# Patient Record
Sex: Male | Born: 1950 | Race: White | Hispanic: No | Marital: Married | Smoking: Former smoker
Health system: Southern US, Community
[De-identification: ages and names within clinical notes are randomized; demographics above are authoritative.]

## PROBLEM LIST (undated history)

## (undated) DIAGNOSIS — I10 Essential (primary) hypertension: Secondary | ICD-10-CM

## (undated) DIAGNOSIS — K8020 Calculus of gallbladder without cholecystitis without obstruction: Secondary | ICD-10-CM

## (undated) DIAGNOSIS — N183 Chronic kidney disease, stage 3 unspecified: Secondary | ICD-10-CM

## (undated) DIAGNOSIS — E119 Type 2 diabetes mellitus without complications: Secondary | ICD-10-CM

## (undated) DIAGNOSIS — I2510 Atherosclerotic heart disease of native coronary artery without angina pectoris: Principal | ICD-10-CM

## (undated) DIAGNOSIS — I5030 Unspecified diastolic (congestive) heart failure: Secondary | ICD-10-CM

## (undated) DIAGNOSIS — G4733 Obstructive sleep apnea (adult) (pediatric): Secondary | ICD-10-CM

## (undated) DIAGNOSIS — E785 Hyperlipidemia, unspecified: Secondary | ICD-10-CM

## (undated) HISTORY — DX: Hyperlipidemia, unspecified: E78.5

## (undated) HISTORY — PX: CARDIAC CATHETERIZATION: SHX172

## (undated) HISTORY — PX: TONSILLECTOMY: SHX5217

## (undated) HISTORY — DX: Chronic kidney disease, stage 3 (moderate): N18.3

## (undated) HISTORY — DX: Atherosclerotic heart disease of native coronary artery without angina pectoris: I25.10

## (undated) HISTORY — DX: Unspecified diastolic (congestive) heart failure: I50.30

## (undated) HISTORY — DX: Morbid (severe) obesity due to excess calories: E66.01

## (undated) HISTORY — PX: CATARACT EXTRACTION: SUR2

## (undated) HISTORY — PX: LAPAROSCOPIC GASTRIC BANDING: SHX1100

## (undated) HISTORY — DX: Calculus of gallbladder without cholecystitis without obstruction: K80.20

## (undated) HISTORY — DX: Obstructive sleep apnea (adult) (pediatric): G47.33

## (undated) HISTORY — PX: TOTAL KNEE ARTHROPLASTY: SHX125

## (undated) HISTORY — DX: Essential (primary) hypertension: I10

## (undated) HISTORY — DX: Type 2 diabetes mellitus without complications: E11.9

## (undated) HISTORY — DX: Chronic kidney disease, stage 3 unspecified: N18.30

## (undated) HISTORY — PX: RETINAL DETACHMENT SURGERY: SHX105

---

## 2004-03-15 ENCOUNTER — Ambulatory Visit: Admission: RE | Admit: 2004-03-15 | Discharge: 2004-03-15 | Payer: Self-pay | Admitting: Orthopedic Surgery

## 2004-03-15 ENCOUNTER — Encounter: Payer: Self-pay | Admitting: Internal Medicine

## 2004-03-16 ENCOUNTER — Ambulatory Visit: Payer: Self-pay | Admitting: Internal Medicine

## 2004-03-18 ENCOUNTER — Encounter: Payer: Self-pay | Admitting: Internal Medicine

## 2004-03-18 ENCOUNTER — Ambulatory Visit: Payer: Self-pay

## 2004-03-22 ENCOUNTER — Ambulatory Visit: Admission: RE | Admit: 2004-03-22 | Discharge: 2004-03-22 | Payer: Self-pay | Admitting: Orthopedic Surgery

## 2004-03-25 ENCOUNTER — Ambulatory Visit: Payer: Self-pay | Admitting: Internal Medicine

## 2004-03-30 ENCOUNTER — Ambulatory Visit: Payer: Self-pay | Admitting: Internal Medicine

## 2004-04-02 ENCOUNTER — Inpatient Hospital Stay (HOSPITAL_BASED_OUTPATIENT_CLINIC_OR_DEPARTMENT_OTHER): Admission: RE | Admit: 2004-04-02 | Discharge: 2004-04-02 | Payer: Self-pay | Admitting: Internal Medicine

## 2004-04-09 ENCOUNTER — Ambulatory Visit: Payer: Self-pay | Admitting: Cardiology

## 2004-04-15 ENCOUNTER — Ambulatory Visit: Payer: Self-pay | Admitting: Internal Medicine

## 2004-04-29 ENCOUNTER — Ambulatory Visit: Payer: Self-pay | Admitting: Cardiology

## 2004-05-04 ENCOUNTER — Inpatient Hospital Stay (HOSPITAL_COMMUNITY): Admission: RE | Admit: 2004-05-04 | Discharge: 2004-05-07 | Payer: Self-pay | Admitting: Orthopedic Surgery

## 2004-05-04 ENCOUNTER — Ambulatory Visit: Payer: Self-pay | Admitting: Internal Medicine

## 2004-07-14 ENCOUNTER — Ambulatory Visit: Payer: Self-pay | Admitting: Internal Medicine

## 2004-10-18 ENCOUNTER — Ambulatory Visit: Payer: Self-pay | Admitting: Internal Medicine

## 2005-01-18 ENCOUNTER — Ambulatory Visit: Payer: Self-pay | Admitting: Internal Medicine

## 2005-03-01 ENCOUNTER — Ambulatory Visit: Payer: Self-pay | Admitting: Internal Medicine

## 2005-03-03 ENCOUNTER — Ambulatory Visit: Payer: Self-pay | Admitting: Emergency Medicine

## 2005-04-05 ENCOUNTER — Encounter: Payer: Self-pay | Admitting: Internal Medicine

## 2005-04-05 ENCOUNTER — Ambulatory Visit (HOSPITAL_BASED_OUTPATIENT_CLINIC_OR_DEPARTMENT_OTHER): Admission: RE | Admit: 2005-04-05 | Discharge: 2005-04-05 | Payer: Self-pay | Admitting: Emergency Medicine

## 2005-04-11 ENCOUNTER — Inpatient Hospital Stay (HOSPITAL_COMMUNITY): Admission: RE | Admit: 2005-04-11 | Discharge: 2005-04-14 | Payer: Self-pay | Admitting: Orthopedic Surgery

## 2005-04-11 ENCOUNTER — Ambulatory Visit: Payer: Self-pay | Admitting: Internal Medicine

## 2005-04-13 ENCOUNTER — Ambulatory Visit: Payer: Self-pay | Admitting: Pulmonary Disease

## 2005-04-19 ENCOUNTER — Ambulatory Visit: Payer: Self-pay | Admitting: Internal Medicine

## 2005-05-27 ENCOUNTER — Ambulatory Visit: Payer: Self-pay | Admitting: Emergency Medicine

## 2005-07-18 ENCOUNTER — Ambulatory Visit: Payer: Self-pay | Admitting: Internal Medicine

## 2005-08-26 ENCOUNTER — Ambulatory Visit: Payer: Self-pay | Admitting: Internal Medicine

## 2005-08-31 ENCOUNTER — Ambulatory Visit: Payer: Self-pay | Admitting: Internal Medicine

## 2005-09-14 ENCOUNTER — Ambulatory Visit: Payer: Self-pay | Admitting: Emergency Medicine

## 2005-09-14 ENCOUNTER — Encounter: Payer: Self-pay | Admitting: Internal Medicine

## 2005-10-17 ENCOUNTER — Ambulatory Visit: Payer: Self-pay | Admitting: Internal Medicine

## 2006-01-17 ENCOUNTER — Ambulatory Visit: Payer: Self-pay | Admitting: Internal Medicine

## 2006-04-18 ENCOUNTER — Ambulatory Visit: Payer: Self-pay | Admitting: Internal Medicine

## 2006-04-18 LAB — CONVERTED CEMR LAB
ALT: 23 units/L (ref 0–40)
AST: 26 units/L (ref 0–37)
Albumin: 3.9 g/dL (ref 3.5–5.2)
Alkaline Phosphatase: 54 units/L (ref 39–117)
BUN: 26 mg/dL — ABNORMAL HIGH (ref 6–23)
CO2: 27 meq/L (ref 19–32)
Calcium: 9.8 mg/dL (ref 8.4–10.5)
Chloride: 104 meq/L (ref 96–112)
Creatinine, Ser: 1.5 mg/dL (ref 0.4–1.5)
Creatinine,U: 174.8 mg/dL
GFR calc non Af Amer: 51 mL/min
Glomerular Filtration Rate, Af Am: 62 mL/min/{1.73_m2}
Glucose, Bld: 86 mg/dL (ref 70–99)
Hgb A1c MFr Bld: 7.9 % — ABNORMAL HIGH (ref 4.6–6.0)
Microalb Creat Ratio: 71.5 mg/g — ABNORMAL HIGH (ref 0.0–30.0)
Microalb, Ur: 12.5 mg/dL — ABNORMAL HIGH (ref 0.0–1.9)
Potassium: 4.7 meq/L (ref 3.5–5.1)
Sodium: 142 meq/L (ref 135–145)
Total Bilirubin: 0.6 mg/dL (ref 0.3–1.2)
Total Protein: 6.4 g/dL (ref 6.0–8.3)

## 2006-04-27 ENCOUNTER — Ambulatory Visit: Payer: Self-pay | Admitting: Internal Medicine

## 2006-07-24 ENCOUNTER — Ambulatory Visit: Payer: Self-pay | Admitting: Internal Medicine

## 2006-07-24 LAB — CONVERTED CEMR LAB: Hgb A1c MFr Bld: 6.6 % — ABNORMAL HIGH (ref 4.6–6.0)

## 2006-10-24 ENCOUNTER — Ambulatory Visit: Payer: Self-pay | Admitting: Internal Medicine

## 2006-10-26 ENCOUNTER — Encounter: Payer: Self-pay | Admitting: Internal Medicine

## 2006-10-26 DIAGNOSIS — I2510 Atherosclerotic heart disease of native coronary artery without angina pectoris: Secondary | ICD-10-CM

## 2006-10-26 DIAGNOSIS — I110 Hypertensive heart disease with heart failure: Secondary | ICD-10-CM | POA: Insufficient documentation

## 2006-10-26 DIAGNOSIS — E785 Hyperlipidemia, unspecified: Secondary | ICD-10-CM

## 2006-10-26 DIAGNOSIS — E119 Type 2 diabetes mellitus without complications: Secondary | ICD-10-CM

## 2006-10-26 DIAGNOSIS — G4733 Obstructive sleep apnea (adult) (pediatric): Secondary | ICD-10-CM

## 2006-10-26 DIAGNOSIS — I10 Essential (primary) hypertension: Secondary | ICD-10-CM

## 2006-10-26 HISTORY — DX: Obstructive sleep apnea (adult) (pediatric): G47.33

## 2006-10-26 HISTORY — DX: Hyperlipidemia, unspecified: E78.5

## 2006-10-26 HISTORY — DX: Atherosclerotic heart disease of native coronary artery without angina pectoris: I25.10

## 2006-10-26 HISTORY — DX: Type 2 diabetes mellitus without complications: E11.9

## 2006-10-26 HISTORY — DX: Essential (primary) hypertension: I10

## 2007-01-23 ENCOUNTER — Ambulatory Visit: Payer: Self-pay | Admitting: Internal Medicine

## 2007-01-23 HISTORY — DX: Morbid (severe) obesity due to excess calories: E66.01

## 2007-01-23 LAB — CONVERTED CEMR LAB
Blood Glucose, Fingerstick: 90
Hgb A1c MFr Bld: 7.4 % — ABNORMAL HIGH (ref 4.6–6.0)

## 2007-01-25 ENCOUNTER — Encounter: Admission: RE | Admit: 2007-01-25 | Discharge: 2007-04-17 | Payer: Self-pay | Admitting: Internal Medicine

## 2007-01-25 ENCOUNTER — Encounter: Payer: Self-pay | Admitting: Internal Medicine

## 2007-02-26 ENCOUNTER — Encounter: Payer: Self-pay | Admitting: Internal Medicine

## 2007-02-26 ENCOUNTER — Telehealth: Payer: Self-pay | Admitting: Internal Medicine

## 2007-03-29 ENCOUNTER — Encounter: Payer: Self-pay | Admitting: Internal Medicine

## 2007-06-01 ENCOUNTER — Ambulatory Visit: Payer: Self-pay | Admitting: Internal Medicine

## 2007-06-01 LAB — CONVERTED CEMR LAB: Hgb A1c MFr Bld: 7.3 % — ABNORMAL HIGH (ref 4.6–6.0)

## 2007-06-05 ENCOUNTER — Encounter: Admission: RE | Admit: 2007-06-05 | Discharge: 2007-06-05 | Payer: Self-pay | Admitting: Internal Medicine

## 2007-06-05 ENCOUNTER — Encounter: Payer: Self-pay | Admitting: Internal Medicine

## 2007-06-27 ENCOUNTER — Telehealth: Payer: Self-pay | Admitting: Internal Medicine

## 2007-06-28 ENCOUNTER — Ambulatory Visit: Payer: Self-pay | Admitting: Internal Medicine

## 2007-07-09 ENCOUNTER — Encounter: Payer: Self-pay | Admitting: Internal Medicine

## 2007-08-10 ENCOUNTER — Telehealth: Payer: Self-pay | Admitting: Internal Medicine

## 2007-08-13 ENCOUNTER — Ambulatory Visit: Payer: Self-pay | Admitting: Internal Medicine

## 2007-08-20 ENCOUNTER — Telehealth: Payer: Self-pay | Admitting: Internal Medicine

## 2007-08-30 ENCOUNTER — Ambulatory Visit: Payer: Self-pay | Admitting: Internal Medicine

## 2007-12-18 ENCOUNTER — Telehealth: Payer: Self-pay | Admitting: Internal Medicine

## 2007-12-19 ENCOUNTER — Encounter: Payer: Self-pay | Admitting: Internal Medicine

## 2007-12-26 ENCOUNTER — Ambulatory Visit (HOSPITAL_COMMUNITY): Admission: RE | Admit: 2007-12-26 | Discharge: 2007-12-26 | Payer: Self-pay | Admitting: Surgery

## 2007-12-27 ENCOUNTER — Ambulatory Visit: Payer: Self-pay | Admitting: Internal Medicine

## 2007-12-28 ENCOUNTER — Ambulatory Visit (HOSPITAL_COMMUNITY): Admission: RE | Admit: 2007-12-28 | Discharge: 2007-12-28 | Payer: Self-pay | Admitting: General Surgery

## 2007-12-28 LAB — CONVERTED CEMR LAB
ALT: 24 units/L (ref 0–53)
AST: 26 units/L (ref 0–37)
Albumin: 3.4 g/dL — ABNORMAL LOW (ref 3.5–5.2)
Alkaline Phosphatase: 60 units/L (ref 39–117)
BUN: 24 mg/dL — ABNORMAL HIGH (ref 6–23)
Basophils Absolute: 0 10*3/uL (ref 0.0–0.1)
Basophils Relative: 0.1 % (ref 0.0–3.0)
Bilirubin, Direct: 0.1 mg/dL (ref 0.0–0.3)
CO2: 31 meq/L (ref 19–32)
Calcium: 9.2 mg/dL (ref 8.4–10.5)
Chloride: 101 meq/L (ref 96–112)
Creatinine, Ser: 1.4 mg/dL (ref 0.4–1.5)
Eosinophils Absolute: 0.2 10*3/uL (ref 0.0–0.7)
Eosinophils Relative: 2 % (ref 0.0–5.0)
GFR calc Af Amer: 67 mL/min
GFR calc non Af Amer: 55 mL/min
Glucose, Bld: 190 mg/dL — ABNORMAL HIGH (ref 70–99)
HCT: 35.1 % — ABNORMAL LOW (ref 39.0–52.0)
Hemoglobin: 12.2 g/dL — ABNORMAL LOW (ref 13.0–17.0)
Hgb A1c MFr Bld: 7.9 % — ABNORMAL HIGH (ref 4.6–6.0)
Lymphocytes Relative: 16.1 % (ref 12.0–46.0)
MCHC: 34.9 g/dL (ref 30.0–36.0)
MCV: 84 fL (ref 78.0–100.0)
Monocytes Absolute: 0.6 10*3/uL (ref 0.1–1.0)
Monocytes Relative: 7.8 % (ref 3.0–12.0)
Neutro Abs: 6.2 10*3/uL (ref 1.4–7.7)
Neutrophils Relative %: 74 % (ref 43.0–77.0)
Platelets: 190 10*3/uL (ref 150–400)
Potassium: 4.2 meq/L (ref 3.5–5.1)
RBC: 4.18 M/uL — ABNORMAL LOW (ref 4.22–5.81)
RDW: 14.2 % (ref 11.5–14.6)
Sodium: 140 meq/L (ref 135–145)
T3, Free: 3.1 pg/mL (ref 2.3–4.2)
T4, Total: 8.6 ug/dL (ref 5.0–12.5)
TSH: 1.04 microintl units/mL (ref 0.35–5.50)
Total Bilirubin: 0.7 mg/dL (ref 0.3–1.2)
Total Protein: 6 g/dL (ref 6.0–8.3)
WBC: 8.3 10*3/uL (ref 4.5–10.5)

## 2008-02-14 ENCOUNTER — Encounter: Payer: Self-pay | Admitting: Internal Medicine

## 2008-02-25 ENCOUNTER — Telehealth: Payer: Self-pay | Admitting: Internal Medicine

## 2008-03-03 ENCOUNTER — Encounter: Payer: Self-pay | Admitting: Internal Medicine

## 2008-03-25 ENCOUNTER — Ambulatory Visit: Payer: Self-pay | Admitting: Internal Medicine

## 2008-03-25 DIAGNOSIS — K8020 Calculus of gallbladder without cholecystitis without obstruction: Secondary | ICD-10-CM | POA: Insufficient documentation

## 2008-03-25 HISTORY — DX: Calculus of gallbladder without cholecystitis without obstruction: K80.20

## 2008-03-25 LAB — CONVERTED CEMR LAB: Hgb A1c MFr Bld: 6.5 % — ABNORMAL HIGH (ref 4.6–6.0)

## 2008-04-07 ENCOUNTER — Telehealth: Payer: Self-pay | Admitting: Internal Medicine

## 2008-05-06 ENCOUNTER — Telehealth: Payer: Self-pay | Admitting: Internal Medicine

## 2008-06-18 ENCOUNTER — Telehealth: Payer: Self-pay | Admitting: Internal Medicine

## 2008-06-19 ENCOUNTER — Encounter: Payer: Self-pay | Admitting: Internal Medicine

## 2008-07-03 ENCOUNTER — Encounter: Admission: RE | Admit: 2008-07-03 | Discharge: 2008-10-01 | Payer: Self-pay | Admitting: Surgery

## 2008-07-14 ENCOUNTER — Ambulatory Visit: Payer: Self-pay | Admitting: Internal Medicine

## 2008-07-22 ENCOUNTER — Ambulatory Visit (HOSPITAL_COMMUNITY): Admission: RE | Admit: 2008-07-22 | Discharge: 2008-07-23 | Payer: Self-pay | Admitting: Surgery

## 2008-10-03 ENCOUNTER — Ambulatory Visit: Payer: Self-pay | Admitting: Internal Medicine

## 2008-10-03 LAB — CONVERTED CEMR LAB: Hgb A1c MFr Bld: 6.1 % (ref 4.6–6.5)

## 2008-10-15 ENCOUNTER — Telehealth (INDEPENDENT_AMBULATORY_CARE_PROVIDER_SITE_OTHER): Payer: Self-pay | Admitting: *Deleted

## 2008-10-15 ENCOUNTER — Encounter: Admission: RE | Admit: 2008-10-15 | Discharge: 2008-10-15 | Payer: Self-pay | Admitting: Surgery

## 2008-12-03 ENCOUNTER — Ambulatory Visit: Payer: Self-pay | Admitting: Internal Medicine

## 2009-01-30 ENCOUNTER — Telehealth (INDEPENDENT_AMBULATORY_CARE_PROVIDER_SITE_OTHER): Payer: Self-pay | Admitting: *Deleted

## 2009-02-02 ENCOUNTER — Ambulatory Visit: Payer: Self-pay | Admitting: Internal Medicine

## 2009-02-26 ENCOUNTER — Telehealth: Payer: Self-pay | Admitting: Internal Medicine

## 2009-03-06 ENCOUNTER — Ambulatory Visit: Payer: Self-pay | Admitting: Internal Medicine

## 2009-03-06 LAB — CONVERTED CEMR LAB: Hgb A1c MFr Bld: 6.5 % (ref 4.6–6.5)

## 2009-03-09 ENCOUNTER — Telehealth: Payer: Self-pay | Admitting: Internal Medicine

## 2009-06-05 ENCOUNTER — Ambulatory Visit: Payer: Self-pay | Admitting: Internal Medicine

## 2009-06-05 LAB — CONVERTED CEMR LAB: Hgb A1c MFr Bld: 6.7 % — ABNORMAL HIGH (ref 4.6–6.5)

## 2009-08-13 ENCOUNTER — Ambulatory Visit: Payer: Self-pay | Admitting: Internal Medicine

## 2009-08-13 LAB — CONVERTED CEMR LAB
ALT: 17 units/L (ref 0–53)
AST: 22 units/L (ref 0–37)
Albumin: 3.5 g/dL (ref 3.5–5.2)
Alkaline Phosphatase: 34 units/L — ABNORMAL LOW (ref 39–117)
BUN: 23 mg/dL (ref 6–23)
Basophils Absolute: 0.1 10*3/uL (ref 0.0–0.1)
Basophils Relative: 0.7 % (ref 0.0–3.0)
Bilirubin Urine: NEGATIVE
Bilirubin, Direct: 0 mg/dL (ref 0.0–0.3)
Blood in Urine, dipstick: NEGATIVE
CO2: 32 meq/L (ref 19–32)
Calcium: 8.9 mg/dL (ref 8.4–10.5)
Chloride: 106 meq/L (ref 96–112)
Cholesterol: 116 mg/dL (ref 0–200)
Creatinine, Ser: 1.3 mg/dL (ref 0.4–1.5)
Creatinine,U: 47.8 mg/dL
Eosinophils Absolute: 0.2 10*3/uL (ref 0.0–0.7)
Eosinophils Relative: 2 % (ref 0.0–5.0)
GFR calc non Af Amer: 59.89 mL/min (ref >60)
Glucose, Bld: 100 mg/dL — ABNORMAL HIGH (ref 70–99)
Glucose, Urine, Semiquant: NEGATIVE
HCT: 34.7 % — ABNORMAL LOW (ref 39.0–52.0)
HDL: 39.1 mg/dL (ref >39.00)
Hemoglobin: 12 g/dL — ABNORMAL LOW (ref 13.0–17.0)
Hgb A1c MFr Bld: 6.5 % (ref 4.6–6.5)
Ketones, urine, test strip: NEGATIVE
LDL Cholesterol: 60 mg/dL (ref 0–99)
Lymphocytes Relative: 15.2 % (ref 12.0–46.0)
Lymphs Abs: 1.2 10*3/uL (ref 0.7–4.0)
MCHC: 34.5 g/dL (ref 30.0–36.0)
MCV: 83.9 fL (ref 78.0–100.0)
Microalb Creat Ratio: 709.2 mg/g — ABNORMAL HIGH (ref 0.0–30.0)
Microalb, Ur: 33.9 mg/dL — ABNORMAL HIGH (ref 0.0–1.9)
Monocytes Absolute: 0.6 10*3/uL (ref 0.1–1.0)
Monocytes Relative: 7.9 % (ref 3.0–12.0)
Neutro Abs: 6.1 10*3/uL (ref 1.4–7.7)
Neutrophils Relative %: 74.2 % (ref 43.0–77.0)
Nitrite: NEGATIVE
Platelets: 184 10*3/uL (ref 150.0–400.0)
Potassium: 4.5 meq/L (ref 3.5–5.1)
RBC: 4.14 M/uL — ABNORMAL LOW (ref 4.22–5.81)
RDW: 15.6 % — ABNORMAL HIGH (ref 11.5–14.6)
Sodium: 144 meq/L (ref 135–145)
Specific Gravity, Urine: 1.01
TSH: 0.84 microintl units/mL (ref 0.35–5.50)
Total Bilirubin: 0.4 mg/dL (ref 0.3–1.2)
Total CHOL/HDL Ratio: 3
Total Protein: 6 g/dL (ref 6.0–8.3)
Triglycerides: 83 mg/dL (ref 0.0–149.0)
Urobilinogen, UA: 0.2
VLDL: 16.6 mg/dL (ref 0.0–40.0)
WBC Urine, dipstick: NEGATIVE
WBC: 8.2 10*3/uL (ref 4.5–10.5)
pH: 5

## 2009-08-20 ENCOUNTER — Ambulatory Visit: Payer: Self-pay | Admitting: Internal Medicine

## 2009-08-22 ENCOUNTER — Encounter: Payer: Self-pay | Admitting: Internal Medicine

## 2009-09-08 ENCOUNTER — Encounter: Payer: Self-pay | Admitting: Internal Medicine

## 2009-09-24 ENCOUNTER — Encounter: Payer: Self-pay | Admitting: Internal Medicine

## 2009-12-22 ENCOUNTER — Telehealth: Payer: Self-pay | Admitting: Internal Medicine

## 2010-01-04 ENCOUNTER — Ambulatory Visit (HOSPITAL_COMMUNITY)
Admission: RE | Admit: 2010-01-04 | Discharge: 2010-01-04 | Payer: Self-pay | Source: Home / Self Care | Admitting: Ophthalmology

## 2010-01-18 ENCOUNTER — Ambulatory Visit: Payer: Self-pay | Admitting: Internal Medicine

## 2010-01-18 LAB — CONVERTED CEMR LAB: Hgb A1c MFr Bld: 6.8 % — ABNORMAL HIGH (ref 4.6–6.5)

## 2010-04-19 ENCOUNTER — Ambulatory Visit: Payer: Self-pay | Admitting: Internal Medicine

## 2010-04-20 LAB — CONVERTED CEMR LAB: Hgb A1c MFr Bld: 7.3 % — ABNORMAL HIGH (ref 4.6–6.5)

## 2010-04-26 ENCOUNTER — Telehealth: Payer: Self-pay

## 2010-05-14 ENCOUNTER — Telehealth: Payer: Self-pay | Admitting: Internal Medicine

## 2010-06-08 NOTE — Letter (Signed)
Summary: [facility]  [facility]   Imported By: Maryln Gottron 09/15/2009 12:15:01  _____________________________________________________________________  External Attachment:    Type:   Image     Comment:   External Document

## 2010-06-08 NOTE — Letter (Signed)
Summary: Diabetic Eye Exam/Steven Hanley Seamen, OD  Diabetic Eye Exam/Steven Bernstorf, OD   Imported By: Maryln Gottron 08/27/2009 14:30:55  _____________________________________________________________________  External Attachment:    Type:   Image     Comment:   External Document

## 2010-06-08 NOTE — Assessment & Plan Note (Signed)
Summary: 3 month rov /njr pt rsc/njr/pt rsc/cjr   Vital Signs:  Patient profile:   60 year old male Weight:      272 pounds Temp:     98.5 degrees F oral BP sitting:   112 / 70  (right arm) Cuff size:   large  Vitals Entered By: Duard Brady LPN (January 18, 2010 8:00 AM) CC: 3 mos rov -doing ok    has eye surg.       fbs 121 Is Patient Diabetic? Yes Did you bring your meter with you today? No   CC:  3 mos rov -doing ok    has eye surg.       fbs 121.  History of Present Illness: 60 year old patient who is seen today for follow-up of his type 2 diabetes.recently he has been followed closely by ophthalmology and has had laser therapy and also treatment for a retinal tear on the left.  He is maintained nice glycemic control.  He has had forced to inactivity due to his eye problems and has gained weight.  He has hypertension, dyslipidemia, coronary artery disease.  He has morbid obesity. he denies any cardiopulmonary complaints  Allergies (verified): No Known Drug Allergies  Past History:  Past Medical History: Reviewed history from 03/25/2008 and no changes required. Diabetes mellitus, type II Hyperlipidemia Hypertension DJD OSA  11/06 Coronary artery disease morbid obesity diastolic dysfunction Cholelithiasis  Past Surgical History: Reviewed history from 08/20/2009 and no changes required. Tonsillectomy Total knee replacement bilateral; 12/06. 11/05 cardiac catheterization November 2005 showed ejection fraction of 50% with inferior hypokinesis; total proximal RCA with distal vessel filling from left to right collaterals; LAD 60% proximal and 40% mid; left circumflex 50% mid to distal lesion  status post gastric bypass surgery, July 22, 2008 no prior colonoscopy  Family History: Reviewed history from 06/01/2007 and no changes required. both parents died in  their mid 10s, both with diabetes and hypertension; father with the peripheral vascular disease,  coronary artery disease  one sister with diabetes  Review of Systems       The patient complains of weight gain and vision loss.  The patient denies anorexia, fever, weight loss, decreased hearing, hoarseness, chest pain, syncope, dyspnea on exertion, peripheral edema, prolonged cough, headaches, hemoptysis, abdominal pain, melena, hematochezia, severe indigestion/heartburn, hematuria, incontinence, genital sores, muscle weakness, suspicious skin lesions, transient blindness, difficulty walking, depression, unusual weight change, abnormal bleeding, enlarged lymph nodes, angioedema, breast masses, and testicular masses.    Physical Exam  General:  overweight-appearing. blood pressure 130/70 Head:  Normocephalic and atraumatic without obvious abnormalities. No apparent alopecia or balding. Eyes:  left fundus non-visualized Mouth:  Oral mucosa and oropharynx without lesions or exudates.  Teeth in good repair. Neck:  No deformities, masses, or tenderness noted. Lungs:  Normal respiratory effort, chest expands symmetrically. Lungs are clear to auscultation, no crackles or wheezes. Heart:  Normal rate and regular rhythm. S1 and S2 normal without gallop, murmur, click, rub or other extra sounds. Abdomen:  Bowel sounds positive,abdomen soft and non-tender without masses, organomegaly or hernias noted. Msk:  No deformity or scoliosis noted of thoracic or lumbar spine.   Pulses:  R and L carotid,radial,femoral,dorsalis pedis and posterior tibial pulses are full and equal bilaterally   Impression & Recommendations:  Problem # 1:  OBESITY, MORBID (ICD-278.01) weight-loss encouraged  Problem # 2:  CORONARY ARTERY DISEASE (ICD-414.00)  His updated medication list for this problem includes:    Aspir-mox 75-325-75 Mg Tabs (Aspirin  buff (al hyd-mg hyd)) .Marland Kitchen... Take    Carvedilol 25 Mg Tabs (Carvedilol) .Marland Kitchen..Marland Kitchen Two twice daily    Furosemide 40 Mg Tabs (Furosemide) ..... One twice daily as needed     Benazepril Hcl 20 Mg Tabs (Benazepril hcl) ..... One  twice daily    Amlodipine Besylate 5 Mg Tabs (Amlodipine besylate) ..... One daily  Problem # 3:  HYPERTENSION (ICD-401.9)  His updated medication list for this problem includes:    Carvedilol 25 Mg Tabs (Carvedilol) .Marland Kitchen..Marland Kitchen Two twice daily    Furosemide 40 Mg Tabs (Furosemide) ..... One twice daily as needed    Benazepril Hcl 20 Mg Tabs (Benazepril hcl) ..... One  twice daily    Amlodipine Besylate 5 Mg Tabs (Amlodipine besylate) ..... One daily  Problem # 4:  HYPERLIPIDEMIA (ICD-272.4)  His updated medication list for this problem includes:    Lipitor 80 Mg Tabs (Atorvastatin calcium) .Marland Kitchen... 1 tablet by mouth every night    Tricor 145 Mg Tabs (Fenofibrate) .Marland Kitchen... Take 1 tablet by mouth once a day  Problem # 5:  DIABETES MELLITUS, TYPE II (ICD-250.00)  His updated medication list for this problem includes:    Aspir-mox 75-325-75 Mg Tabs (Aspirin buff (al hyd-mg hyd)) .Marland Kitchen... Take    Metformin Hcl 1000 Mg Tabs (Metformin hcl) .Marland Kitchen... Take 1 tablet by mouth twice a day    Novolog Flexpen 100 Unit/ml Soln (Insulin aspart) .Marland Kitchen... As directed according to sliding scale  three times a day at meals    Benazepril Hcl 20 Mg Tabs (Benazepril hcl) ..... One  twice daily    Lantus Solostar 100 Unit/ml Soln (Insulin glargine) ..... Injest 12 untis at bedtime  Orders: Venipuncture (09811) TLB-A1C / Hgb A1C (Glycohemoglobin) (83036-A1C) Specimen Handling (91478)  Complete Medication List: 1)  Aspir-mox 75-325-75 Mg Tabs (Aspirin buff (al hyd-mg hyd)) .... Take 2)  Lipitor 80 Mg Tabs (Atorvastatin calcium) .Marland Kitchen.. 1 tablet by mouth every night 3)  Metformin Hcl 1000 Mg Tabs (Metformin hcl) .... Take 1 tablet by mouth twice a day 4)  Tricor 145 Mg Tabs (Fenofibrate) .... Take 1 tablet by mouth once a day 5)  Carvedilol 25 Mg Tabs (Carvedilol) .... Two twice daily 6)  Furosemide 40 Mg Tabs (Furosemide) .... One twice daily as needed 7)  Klor-con M20 20  Meq Tbcr (Potassium chloride crys cr) .... One twice daily 8)  Lifestyle Lite Test Strips  .... Use four times daily 9)  Novolog Flexpen 100 Unit/ml Soln (Insulin aspart) .... As directed according to sliding scale  three times a day at meals 10)  Bd Ultra-fine Pen Needles 29g X 12.75mm Misc (Insulin pen needle) .... Used daily 11)  Benazepril Hcl 20 Mg Tabs (Benazepril hcl) .... One  twice daily 12)  Amlodipine Besylate 5 Mg Tabs (Amlodipine besylate) .... One daily 13)  Lantus Solostar 100 Unit/ml Soln (Insulin glargine) .... Injest 12 untis at bedtime  Patient Instructions: 1)  Please schedule a follow-up appointment in 3 months. 2)  Limit your Sodium (Salt). 3)  It is important that you exercise regularly at least 20 minutes 5 times a week. If you develop chest pain, have severe difficulty breathing, or feel very tired , stop exercising immediately and seek medical attention. 4)  You need to lose weight. Consider a lower calorie diet and regular exercise.  5)  Check your blood sugars regularly. If your readings are usually above : or below 70 you should contact our office. 6)  It is important that your  Diabetic A1c level is checked every 3 months. 7)  See your eye doctor yearly to check for diabetic eye damage.

## 2010-06-08 NOTE — Assessment & Plan Note (Signed)
Summary: cpx/njr   Vital Signs:  Patient profile:   60 year old male Height:      67.5 inches Weight:      255 pounds Temp:     98.1 degrees F oral BP sitting:   140 / 70  (right arm) Cuff size:   regular  Vitals Entered By: Duard Brady LPN (August 20, 2009 8:37 AM) CC: cpx - labs last wk      fbs 107 Is Patient Diabetic? Yes Did you bring your meter with you today? No   CC:  cpx - labs last wk      fbs 107.  History of Present Illness: 60 year old patient who is seen today for a preventive health examination.  She has a history of morbid obesity and is now approximately 13 months status post gastric bypass.  He is lost in excess of 100 pounds.  He is cornea artery disease, status post CABG.  History of hypertension, dyslipidemia, a long history of type 2 diabetes.  He is doing quite well clinically.  He has osteoarthritis and is status post bilateral knee replacement surgeries.  His activities are limited somewhat by low back pain.  Otherwise he does quite well.  No prior colonoscopies  Preventive Screening-Counseling & Management  Alcohol-Tobacco     Smoking Status: never  Allergies (verified): No Known Drug Allergies  Past History:  Past Medical History: Reviewed history from 03/25/2008 and no changes required. Diabetes mellitus, type II Hyperlipidemia Hypertension DJD OSA  11/06 Coronary artery disease morbid obesity diastolic dysfunction Cholelithiasis  Past Surgical History: Tonsillectomy Total knee replacement bilateral; 12/06. 11/05 cardiac catheterization November 2005 showed ejection fraction of 50% with inferior hypokinesis; total proximal RCA with distal vessel filling from left to right collaterals; LAD 60% proximal and 40% mid; left circumflex 50% mid to distal lesion  status post gastric bypass surgery, July 22, 2008 no prior colonoscopy  Family History: Reviewed history from 06/01/2007 and no changes required. both parents died in  their  mid 29s, both with diabetes and hypertension; father with the peripheral vascular disease, coronary artery disease  one sister with diabetes  Social History: Reviewed history from 06/28/2007 and no changes required. Married disabled due to severe arthritisSmoking Status:  never  Review of Systems       The patient complains of weight loss.  The patient denies anorexia, fever, weight gain, vision loss, decreased hearing, hoarseness, chest pain, syncope, dyspnea on exertion, peripheral edema, prolonged cough, headaches, hemoptysis, abdominal pain, melena, hematochezia, severe indigestion/heartburn, hematuria, incontinence, genital sores, muscle weakness, suspicious skin lesions, transient blindness, difficulty walking, depression, unusual weight change, abnormal bleeding, enlarged lymph nodes, angioedema, breast masses, and testicular masses.    Physical Exam  General:  overweight-appearing.  140/60overweight-appearing.   Head:  Normocephalic and atraumatic without obvious abnormalities. No apparent alopecia or balding. Eyes:  No corneal or conjunctival inflammation noted. EOMI. Perrla. Funduscopic exam benign, without hemorrhages, exudates or papilledema. Vision grossly normal. Ears:  External ear exam shows no significant lesions or deformities.  Otoscopic examination reveals clear canals, tympanic membranes are intact bilaterally without bulging, retraction, inflammation or discharge. Hearing is grossly normal bilaterally. Nose:  External nasal examination shows no deformity or inflammation. Nasal mucosa are pink and moist without lesions or exudates. Mouth:  Oral mucosa and oropharynx without lesions or exudates.  Neck:  No deformities, masses, or tenderness noted. Chest Wall:  No deformities, masses, tenderness or gynecomastia noted. Breasts:  No masses or gynecomastia noted Lungs:  Normal respiratory  effort, chest expands symmetrically. Lungs are clear to auscultation, no crackles or  wheezes. Heart:  Normal rate and regular rhythm. S1 and S2 normal without gallop, murmur, click, rub or other extra sounds. Abdomen:  Bowel sounds positive,abdomen soft and non-tender without masses, organomegaly or hernias noted. Rectal:  No external abnormalities noted. Normal sphincter tone. No rectal masses or tenderness. Genitalia:  Testes bilaterally descended without nodularity, tenderness or masses. No scrotal masses or lesions. No penis lesions or urethral discharge. Prostate:  1+ enlarged.  1+ enlarged.   Msk:  No deformity or scoliosis noted of thoracic or lumbar spine.   Pulses:  posterior tibial  not easily palpable Extremities:  No clubbing, cyanosis, edema, or deformity noted with normal full range of motion of all joints.   Neurologic:  alert & oriented X3, cranial nerves II-XII intact, strength normal in all extremities, sensation intact to pinprick, gait normal, and finger-to-nose normal.  alert & oriented X3, cranial nerves II-XII intact, strength normal in all extremities, sensation intact to pinprick, gait normal, and finger-to-nose normal.   Skin:  Intact without suspicious lesions or rashes Cervical Nodes:  No lymphadenopathy noted Axillary Nodes:  No palpable lymphadenopathy Inguinal Nodes:  No significant adenopathy Psych:  Cognition and judgment appear intact. Alert and cooperative with normal attention span and concentration. No apparent delusions, illusions, hallucinations  Diabetes Management Exam:    Foot Exam (with socks and/or shoes not present):       Sensory-Pinprick/Light touch:          Left medial foot (L-4): normal          Left dorsal foot (L-5): normal          Left lateral foot (S-1): normal          Right medial foot (L-4): normal          Right dorsal foot (L-5): normal          Right lateral foot (S-1): normal       Sensory-Monofilament:          Left foot: normal          Right foot: normal       Inspection:          Left foot: normal           Right foot: normal       Nails:          Left foot: normal          Right foot: normal    Foot Exam by Podiatrist:       Date: 08/20/2009       Results: no diabetic findings       Done by: PCP    Eye Exam:       Eye Exam done here today          Results: normal   Impression & Recommendations:  Problem # 1:  Preventive Health Care (ICD-V70.0)  Orders: Gastroenterology Referral (GI)  Complete Medication List: 1)  Aspir-mox 75-325-75 Mg Tabs (Aspirin buff (al hyd-mg hyd)) .... Take 2)  Lantus 100 Unit/ml Soln (Insulin glargine) .Marland Kitchen.. 12 units  at bedtime 3)  Lipitor 80 Mg Tabs (Atorvastatin calcium) .Marland Kitchen.. 1 tablet by mouth every night 4)  Metformin Hcl 1000 Mg Tabs (Metformin hcl) .... Take 1 tablet by mouth twice a day 5)  Tricor 145 Mg Tabs (Fenofibrate) .... Take 1 tablet by mouth once a day 6)  Carvedilol 25 Mg Tabs (Carvedilol) .... Two  twice daily 7)  Furosemide 40 Mg Tabs (Furosemide) .... One twice daily as needed 8)  Klor-con M20 20 Meq Tbcr (Potassium chloride crys cr) .... One twice daily 9)  Lifestyle Lite Test Strips  .... Use four times daily 10)  Novolog Flexpen 100 Unit/ml Soln (Insulin aspart) .... As directed according to sliding scale  three times a day at meals 11)  Bd Ultra-fine Pen Needles 29g X 12.15mm Misc (Insulin pen needle) .... Used daily 12)  Benazepril Hcl 20 Mg Tabs (Benazepril hcl) .... One  twice daily 13)  Amlodipine Besylate 5 Mg Tabs (Amlodipine besylate) .... One daily  Patient Instructions: 1)  It is important that you exercise regularly at least 20 minutes 5 times a week. If you develop chest pain, have severe difficulty breathing, or feel very tired , stop exercising immediately and seek medical attention. 2)  Schedule a colonoscopy/sigmoidoscopy to help detect colon cancer. 3)  Check your blood sugars regularly. If your readings are usually above : or below 70 you should contact our office. 4)  It is important that your Diabetic A1c level is  checked every 3 months. 5)  See your eye doctor yearly to check for diabetic eye damage. Prescriptions: AMLODIPINE BESYLATE 5 MG TABS (AMLODIPINE BESYLATE) one daily  #90 x 6   Entered and Authorized by:   Gordy Savers  MD   Signed by:   Gordy Savers  MD on 08/20/2009   Method used:   Print then Give to Patient   RxID:   [id number] BENAZEPRIL HCL 20 MG TABS (BENAZEPRIL HCL) one  twice daily  #90 x 6   Entered and Authorized by:   Gordy Savers  MD   Signed by:   Gordy Savers  MD on 08/20/2009   Method used:   Print then Give to Patient   RxID:   [id number] BD ULTRA-FINE PEN NEEDLES 29G X 12.7MM MISC (INSULIN PEN NEEDLE) used daily  #90 x 6   Entered and Authorized by:   Gordy Savers  MD   Signed by:   Gordy Savers  MD on 08/20/2009   Method used:   Print then Give to Patient   RxID:   [id number] NOVOLOG FLEXPEN 100 UNIT/ML  SOLN (INSULIN ASPART) as directed according to sliding scale  three times a day at meals  #3 x 0   Entered and Authorized by:   Gordy Savers  MD   Signed by:   Gordy Savers  MD on 08/20/2009   Method used:   Print then Give to Patient   RxID:   [id number] KLOR-CON M20 20 MEQ  TBCR (POTASSIUM CHLORIDE CRYS CR) one twice daily  #180 x 6   Entered and Authorized by:   Gordy Savers  MD   Signed by:   Gordy Savers  MD on 08/20/2009   Method used:   Print then Give to Patient   RxID:   [id number] FUROSEMIDE 40 MG  TABS (FUROSEMIDE) one twice daily as needed  #180 x 6   Entered and Authorized by:   Gordy Savers  MD   Signed by:   Gordy Savers  MD on 08/20/2009   Method used:   Print then Give to Patient   RxID:   [id number] CARVEDILOL 25 MG  TABS (CARVEDILOL) two twice daily  #180 x 7   Entered and Authorized by:   Gordy Savers  MD   Signed by:  Gordy Savers  MD on 08/20/2009   Method used:   Print then Give to Patient   RxID:    [id number] TRICOR 145 MG TABS (FENOFIBRATE) Take 1 tablet by mouth once a day  #90 x 6   Entered and Authorized by:   Gordy Savers  MD   Signed by:   Gordy Savers  MD on 08/20/2009   Method used:   Print then Give to Patient   RxID:   [id number] METFORMIN HCL 1000 MG TABS (METFORMIN HCL) Take 1 tablet by mouth twice a day  #180 x 6   Entered and Authorized by:   Gordy Savers  MD   Signed by:   Gordy Savers  MD on 08/20/2009   Method used:   Print then Give to Patient   RxID:   [id number] LIPITOR 80 MG TABS (ATORVASTATIN CALCIUM) 1 tablet by mouth every night  #90 x 6   Entered and Authorized by:   Gordy Savers  MD   Signed by:   Gordy Savers  MD on 08/20/2009   Method used:   Print then Give to Patient   RxID:   [id number] LANTUS 100 UNIT/ML SOLN (INSULIN GLARGINE) 12 units  at bedtime  #3 x 6   Entered and Authorized by:   Gordy Savers  MD   Signed by:   Gordy Savers  MD on 08/20/2009   Method used:   Print then Give to Patient   RxID:   [id number] AMLODIPINE BESYLATE 5 MG TABS (AMLODIPINE BESYLATE) one daily  #90 x 6   Entered and Authorized by:   Gordy Savers  MD   Signed by:   Gordy Savers  MD on 08/20/2009   Method used:   Electronically to        [facility]  [address] [id number] (retail)       [address]       [place], [place]  [address]       Ph: [phone] or [phone]       Fax: [phone]   RxID:   [id number] BENAZEPRIL HCL 20 MG TABS (BENAZEPRIL HCL) one  twice daily  #90 x 6   Entered and Authorized by:   Gordy Savers  MD   Signed by:   Gordy Savers  MD on 08/20/2009   Method used:   Electronically to        [id number] (retail)       [address]       [place], [place]  [address]       Ph: [phone] or [phone]       Fax: [phone]   RxID:   [id number] BD ULTRA-FINE PEN NEEDLES  29G X 12.7MM MISC (INSULIN PEN NEEDLE) used daily  #90 x 6   Entered and Authorized by:   Gordy Savers  MD   Signed by:   Gordy Savers  MD on 08/20/2009   Method used:   Electronically to        [id number] (retail)       [address]       [place], [place]  [address]       Ph: [phone] or [phone]       Fax: [phone]  RxID:   [id number] NOVOLOG FLEXPEN 100 UNIT/ML  SOLN (INSULIN ASPART) as directed according to sliding scale  three times a day at meals  #3 x 0   Entered and Authorized by:   Gordy Savers  MD   Signed by:   Gordy Savers  MD on 08/20/2009   Method used:   Electronically to        [facility] [id number] (retail)       [address]       [place], [place]  [address]       Ph: [phone] or [phone]       Fax: [phone]   RxID:   [id number] LIFESTYLE LITE TEST STRIPS use four times daily  #480 x 6   Entered and Authorized by:   Gordy Savers  MD   Signed by:   Gordy Savers  MD on 08/20/2009   Method used:   Print then Give to Patient   RxID:   [id number] KLOR-CON M20 20 MEQ  TBCR (POTASSIUM CHLORIDE CRYS CR) one twice daily  #180 x 6   Entered and Authorized by:   Gordy Savers  MD   Signed by:   Gordy Savers  MD on 08/20/2009   Method used:   Electronically to        [facility] [id number] (retail)       [address]       [place], [place]  [address]       Ph: [phone] or [phone]       Fax: [phone]   RxID:   [id number] FUROSEMIDE 40 MG  TABS (FUROSEMIDE) one twice daily as needed  #180 x 6   Entered and Authorized by:   Gordy Savers  MD   Signed by:   Gordy Savers  MD on 08/20/2009   Method used:   Electronically to        [id number] ([facility]ail)       [address]       [place], [place]  [address]       Ph: [phone] or [phone]        Fax: [phone]   RxID:   [id number] CARVEDILOL 25 MG  TABS (CARVEDILOL) two twice daily  #180 x 7   Entered and Authorized by:   Gordy Savers  MD   Signed by:   Gordy Savers  MD on 08/20/2009   Method used:   Electronically to        [id number] ([facility]ail)       [address]       [place], [place]  [address]       Ph: [phone] or [phone]       Fax: [phone]   RxID:   [id number] LIPITOR 80 MG TABS (ATORVASTATIN CALCIUM) 1 tablet by mouth every night  #90 x 6   Entered and Authorized by:   Gordy Savers  MD   Signed by:   Gordy Savers  MD on 08/20/2009   Method used:   Electronically to        [id number] ([facility])       [address]  [place], [place]  [address]       Ph: [phone] or [phone]       Fax: [phone]   RxID:   [id number] TRICOR 145 MG TABS (FENOFIBRATE) Take 1 tablet by mouth once a day  #90 x 6   Entered and Authorized by:   Gordy Savers  MD   Signed by:   Gordy Savers  MD on 08/20/2009   Method used:   Electronically to        [facility] [id number] ([facility])       [address]       [place], [place]  [address]       Ph: [phone] or [phone]       Fax: [phone]   RxID:   [id number] METFORMIN HCL 1000 MG TABS (METFORMIN HCL) Take 1 tablet by mouth twice a day  #180 x 6   Entered and Authorized by:   Gordy Savers  MD   Signed by:   Gordy Savers  MD on 08/20/2009   Method used:   Electronically to        [facility] [address] ([facility])       [address]       [place], [place]  [address]       Ph: [phone] or [phone]       Fax: [phone]   RxID:   [id number] LANTUS 100 UNIT/ML SOLN (INSULIN GLARGINE) 12 units  at bedtime  #3 x 6   Entered and Authorized by:   Gordy Savers  MD   Signed by:   Gordy Savers  MD on  08/20/2009   Method used:   Electronically to        [id number] ([facility]       [address]       [place], [place]  [address]       Ph: [phone] or [phone]       Fax: [phone]   RxID:   [id number]

## 2010-06-08 NOTE — Assessment & Plan Note (Signed)
Summary: 3 month rov/njr   Vital Signs:  Patient profile:   60 year old male Weight:      262 pounds Temp:     98.2 degrees F oral BP sitting:   140 / 78  (left arm) Cuff size:   regular  Vitals Entered By: Raechel Ache, RN (June 05, 2009 8:03 AM) CC: 3 mo ROV. FBS 108. Is Patient Diabetic? Yes   CC:  3 mo ROV. FBS 108.Marland Kitchen  History of Present Illness: 60 year old patient who is in today for follow-up.  He is a history of type 2 diabetes, hypertension, and dyslipidemia.  He is obstructive sleep apnea, and a history of coronary artery disease.  His blood sugars remained stable.  His last hemoglobin A1c6.5.  He denies any cardiopulmonary complaints.  There has been some modest weight gain since his last visit here.  Remains on statin therapy, which he continues to tolerate.  Allergies: No Known Drug Allergies  Past History:  Past Medical History: Reviewed history from 03/25/2008 and no changes required. Diabetes mellitus, type II Hyperlipidemia Hypertension DJD OSA  11/06 Coronary artery disease morbid obesity diastolic dysfunction Cholelithiasis  Past Surgical History: Reviewed history from 10/03/2008 and no changes required. Tonsillectomy Total knee replacement bilateral; 12/06. 11/05 cardiac catheterization November 2005 showed ejection fraction of 50% with inferior hypokinesis; total proximal RCA with distal vessel filling from left to right collaterals; LAD 60% proximal and 40% mid; left circumflex 50% mid to distal lesion  status post gastric bypass surgery, July 22, 2008  Family History: Reviewed history from 06/01/2007 and no changes required. both parents died in  their mid 52s, both with diabetes and hypertension; father with the peripheral vascular disease, coronary artery disease  one sister with diabetes  Review of Systems       The patient complains of weight gain.  The patient denies anorexia, fever, weight loss, vision loss, decreased hearing,  hoarseness, chest pain, syncope, dyspnea on exertion, peripheral edema, prolonged cough, headaches, hemoptysis, abdominal pain, melena, hematochezia, severe indigestion/heartburn, hematuria, incontinence, genital sores, muscle weakness, suspicious skin lesions, transient blindness, difficulty walking, depression, unusual weight change, abnormal bleeding, enlarged lymph nodes, angioedema, breast masses, and testicular masses.    Physical Exam  General:  overweight-appearing. normal blood pressureoverweight-appearing.   Head:  Normocephalic and atraumatic without obvious abnormalities. No apparent alopecia or balding. Eyes:  No corneal or conjunctival inflammation noted. EOMI. Perrla. Funduscopic exam benign, without hemorrhages, exudates or papilledema. Vision grossly normal. Ears:  External ear exam shows no significant lesions or deformities.  Otoscopic examination reveals clear canals, tympanic membranes are intact bilaterally without bulging, retraction, inflammation or discharge. Hearing is grossly normal bilaterally. Mouth:  Oral mucosa and oropharynx without lesions or exudates.  Teeth in good repair. Neck:  No deformities, masses, or tenderness noted. Lungs:  Normal respiratory effort, chest expands symmetrically. Lungs are clear to auscultation, no crackles or wheezes. Heart:  Normal rate and regular rhythm. S1 and S2 normal without gallop, murmur, click, rub or other extra sounds. Abdomen:  Bowel sounds positive,abdomen soft and non-tender without masses, organomegaly or hernias noted.   Impression & Recommendations:  Problem # 1:  CORONARY ARTERY DISEASE (ICD-414.00)  His updated medication list for this problem includes:    Aspir-mox 75-325-75 Mg Tabs (Aspirin buff (al hyd-mg hyd)) .Marland Kitchen... Take    Carvedilol 25 Mg Tabs (Carvedilol) .Marland Kitchen..Marland Kitchen Two twice daily    Furosemide 40 Mg Tabs (Furosemide) ..... One twice daily as needed    Benazepril Hcl  20 Mg Tabs (Benazepril hcl) ..... One  twice  daily    Amlodipine Besylate 5 Mg Tabs (Amlodipine besylate) ..... One daily  His updated medication list for this problem includes:    Aspir-mox 75-325-75 Mg Tabs (Aspirin buff (al hyd-mg hyd)) .Marland Kitchen... Take    Carvedilol 25 Mg Tabs (Carvedilol) .Marland Kitchen..Marland Kitchen Two twice daily    Furosemide 40 Mg Tabs (Furosemide) ..... One twice daily as needed    Benazepril Hcl 20 Mg Tabs (Benazepril hcl) ..... One  twice daily    Amlodipine Besylate 5 Mg Tabs (Amlodipine besylate) ..... One daily  Problem # 2:  HYPERTENSION (ICD-401.9)  His updated medication list for this problem includes:    Carvedilol 25 Mg Tabs (Carvedilol) .Marland Kitchen..Marland Kitchen Two twice daily    Furosemide 40 Mg Tabs (Furosemide) ..... One twice daily as needed    Benazepril Hcl 20 Mg Tabs (Benazepril hcl) ..... One  twice daily    Amlodipine Besylate 5 Mg Tabs (Amlodipine besylate) ..... One daily  His updated medication list for this problem includes:    Carvedilol 25 Mg Tabs (Carvedilol) .Marland Kitchen..Marland Kitchen Two twice daily    Furosemide 40 Mg Tabs (Furosemide) ..... One twice daily as needed    Benazepril Hcl 20 Mg Tabs (Benazepril hcl) ..... One  twice daily    Amlodipine Besylate 5 Mg Tabs (Amlodipine besylate) ..... One daily  Problem # 3:  DIABETES MELLITUS, TYPE II (ICD-250.00)  His updated medication list for this problem includes:    Aspir-mox 75-325-75 Mg Tabs (Aspirin buff (al hyd-mg hyd)) .Marland Kitchen... Take    Lantus 100 Unit/ml Soln (Insulin glargine) .Marland Kitchen... 12 units  at bedtime    Metformin Hcl 1000 Mg Tabs (Metformin hcl) .Marland Kitchen... Take 1 tablet by mouth twice a day    Novolog Flexpen 100 Unit/ml Soln (Insulin aspart) .Marland Kitchen... As directed according to sliding scale  three times a day at meals    Benazepril Hcl 20 Mg Tabs (Benazepril hcl) ..... One  twice daily  His updated medication list for this problem includes:    Aspir-mox 75-325-75 Mg Tabs (Aspirin buff (al hyd-mg hyd)) .Marland Kitchen... Take    Lantus 100 Unit/ml Soln (Insulin glargine) .Marland Kitchen... 12 units  at bedtime     Metformin Hcl 1000 Mg Tabs (Metformin hcl) .Marland Kitchen... Take 1 tablet by mouth twice a day    Novolog Flexpen 100 Unit/ml Soln (Insulin aspart) .Marland Kitchen... As directed according to sliding scale  three times a day at meals    Benazepril Hcl 20 Mg Tabs (Benazepril hcl) ..... One  twice daily  Complete Medication List: 1)  Aspir-mox 75-325-75 Mg Tabs (Aspirin buff (al hyd-mg hyd)) .... Take 2)  Lantus 100 Unit/ml Soln (Insulin glargine) .Marland Kitchen.. 12 units  at bedtime 3)  Lipitor 80 Mg Tabs (Atorvastatin calcium) .Marland Kitchen.. 1 tablet by mouth every night 4)  Metformin Hcl 1000 Mg Tabs (Metformin hcl) .... Take 1 tablet by mouth twice a day 5)  Tricor 145 Mg Tabs (Fenofibrate) .... Take 1 tablet by mouth once a day 6)  Carvedilol 25 Mg Tabs (Carvedilol) .... Two twice daily 7)  Furosemide 40 Mg Tabs (Furosemide) .... One twice daily as needed 8)  Klor-con M20 20 Meq Tbcr (Potassium chloride crys cr) .... One twice daily 9)  Lifestyle Lite Test Strips  .... Use four times daily 10)  Novolog Flexpen 100 Unit/ml Soln (Insulin aspart) .... As directed according to sliding scale  three times a day at meals 11)  Bd Ultra-fine Pen Needles 29g X 12.67mm  Misc (Insulin pen needle) .... Used daily 12)  Benazepril Hcl 20 Mg Tabs (Benazepril hcl) .... One  twice daily 13)  Amlodipine Besylate 5 Mg Tabs (Amlodipine besylate) .... One daily  Patient Instructions: 1)  Please schedule a follow-up appointment in 3 months. 2)  It is important that you exercise regularly at least 20 minutes 5 times a week. If you develop chest pain, have severe difficulty breathing, or feel very tired , stop exercising immediately and seek medical attention. 3)  You need to lose weight. Consider a lower calorie diet and regular exercise.  4)  Check your blood sugars regularly. If your readings are usually above : or below 70 you should contact our office. 5)  It is important that your Diabetic A1c level is checked every 3 months. 6)  See your eye doctor  yearly to check for diabetic eye damage.  Appended Document: 3 month rov/njr

## 2010-06-08 NOTE — Procedures (Signed)
Summary: Colonoscopy Report-Dr. Sharrell Ku  Colonoscopy Report-Dr. Sharrell Ku   Imported By: Maryln Gottron 10/19/2009 15:06:59  _____________________________________________________________________  External Attachment:    Type:   Image     Comment:   External Document

## 2010-06-08 NOTE — Progress Notes (Signed)
Summary: Pt called and is req all his meds be sent to [facility]  Phone Note Refill Request Message from:  Patient on December 22, 2009 9:13 AM  Refills Requested: Medication #1:  ASPIR-MOX 75-325-75 MG TABS Take   Dosage confirmed as above?Dosage Confirmed  Medication #2:  CARVEDILOL 25 MG  TABS two twice daily   Dosage confirmed as above?Dosage Confirmed  Medication #3:  LIPITOR 80 MG TABS 1 tablet by mouth every night [BMN]   Dosage confirmed as above?Dosage Confirmed  Medication #4:  METFORMIN HCL 1000 MG TABS Take 1 tablet by mouth twice a day   Dosage confirmed as above?Dosage Confirmed Tricor, Furosemide,Klorcon,Lifestyle Lite Test Strips, Novolog,BD Ultra Fine Pen Needles, Benazepril,Amlodipine, Lantus Pens instead of vials. Pls call all these into [facility] [phone]     Method Requested: Telephone to [facility] [id number] Initial call taken by: Lucy Antigua,  December 22, 2009 9:13 AM    New/Updated Medications: LANTUS SOLOSTAR 100 UNIT/ML SOLN (INSULIN GLARGINE) injest 12 untis at bedtime Prescriptions: AMLODIPINE BESYLATE 5 MG TABS (AMLODIPINE BESYLATE) one daily  #90 x 3   Entered by:   Duard Brady LPN   Authorized by:   Gordy Savers  MD   Signed by:   Duard Brady LPN on 56/21/3086   Method used:   Faxed to ...       [facility] (mail-order)             , [place]         Ph: [phone]       Fax: [phone]   RxID:   [id number] BENAZEPRIL HCL 20 MG TABS (BENAZEPRIL HCL) one  twice daily  #90 x 3   Entered by:   Duard Brady LPN   Authorized by:   Gordy Savers  MD   Signed by:   Duard Brady LPN on 59/56/3875   Method used:   Faxed to ...       [facility] (mail-order)             , [place]         Ph: [phone]       Fax: [phone]   RxID:   [id number] BD ULTRA-FINE PEN NEEDLES 29G X 12.7MM MISC (INSULIN PEN NEEDLE) used daily  #90 x 3   Entered by:   Duard Brady LPN  Authorized by:   Gordy Savers  MD   Signed by:   Duard Brady LPN on 42/70/6237   Method used:   Faxed to ...       [facility] (mail-order)             , [place]         Ph: [phone]       Fax: [phone]   RxID:   [id number] NOVOLOG FLEXPEN 100 UNIT/ML  SOLN (INSULIN ASPART) as directed according to sliding scale  three times a day at meals  #3 x 3   Entered by:   Duard Brady LPN   Authorized by:   Gordy Savers  MD   Signed by:   Duard Brady LPN on 18/29/9371   Method used:   Faxed to ...       [facility] (mail-order)             , [place]         Ph: [phone]       Fax: [phone]   RxID:   [id number] LIFESTYLE LITE  TEST STRIPS use four times daily  #480 x 3   Entered by:   Duard Brady LPN   Authorized by:   Gordy Savers  MD   Signed by:   Duard Brady LPN on 52/84/1324   Method used:   Faxed to ...       [facility] (mail-order)             , [place]         Ph: [phone]       Fax: [phone]   RxID:   [id number] KLOR-CON M20 20 MEQ  TBCR (POTASSIUM CHLORIDE CRYS CR) one twice daily  #180 x 3   Entered by:   Duard Brady LPN   Authorized by:   Gordy Savers  MD   Signed by:   Duard Brady LPN on 84/16/6063   Method used:   Faxed to ...       [facility] (mail-order)             , [place]         Ph: [phone]       Fax: [phone]   RxID:   [id number] FUROSEMIDE 40 MG  TABS (FUROSEMIDE) one twice daily as needed  #180 x 3   Entered by:   Duard Brady LPN   Authorized by:   Gordy Savers  MD   Signed by:   Duard Brady LPN on 16/11/3708   Method used:   Faxed to ...       [facility] (mail-order)             , [place]         Ph: [phone]       Fax: [phone]   RxID:   [id number] CARVEDILOL 25 MG  TABS (CARVEDILOL) two twice daily  #180 x 3   Entered by:   Duard Brady LPN   Authorized by:   Gordy Savers  MD   Signed by:   Duard Brady LPN on  01/75/1025   Method used:   Faxed to ...       [facility] (mail-order)             , [place]         Ph: [phone]       Fax: [phone]   RxID:   [id number] TRICOR 145 MG TABS (FENOFIBRATE) Take 1 tablet by mouth once a day  #90 x 3   Entered by:   Duard Brady LPN   Authorized by:   Gordy Savers  MD   Signed by:   Duard Brady LPN on 24/58/0998   Method used:   Faxed to ...       [facility] (mail-order)             , [place]         Ph: [phone]       Fax: [phone]   RxID:   [id number] METFORMIN HCL 1000 MG TABS (METFORMIN HCL) Take 1 tablet by mouth twice a day  #180 x 3   Entered by:   Duard Brady LPN   Authorized by:   Gordy Savers  MD   Signed by:   Duard Brady LPN on 34/19/6222   Method used:   Faxed to ...       [facility] (mail-[facility])             , [place]         Ph: [phone]  Fax: [phone]   RxID:   [id number] LIPITOR 80 MG TABS (ATORVASTATIN CALCIUM) 1 tablet by mouth every night Brand medically necessary #90 Tablet x 3   Entered by:   Duard Brady LPN   Authorized by:   Gordy Savers  MD   Signed by:   Duard Brady LPN on 72/53/6644   Method used:   Faxed to ...       [facility] (mail-[facility])             , [place]         Ph: [phone]       Fax: [phone]   RxID:   [id number] LANTUS SOLOSTAR 100 UNIT/ML SOLN (INSULIN GLARGINE) injest 12 untis at bedtime  #90days x 3   Entered by:   Duard Brady LPN   Authorized by:   Gordy Savers  MD   Signed by:   Duard Brady LPN on 09/32/3557   Method used:   Faxed to ...       [facility] (mail[facility])             , [place]         Ph: [phone]       Fax: [phone]   RxID:   [id number]

## 2010-06-10 NOTE — Progress Notes (Signed)
Summary: clarifation on quanity  Phone Note From Pharmacy   Caller: [facility] Summary of Call: need clarification on quanity for pen needles - ref # [id number]   [phone] opt 2 Initial call taken by: Duard Brady LPN,  April 26, 2010 12:46 PM  Follow-up for Phone Call        call and gave new #360 - lantus and sliding scale three times a day . KIK Follow-up by: Duard Brady LPN,  April 26, 2010 12:46 PM

## 2010-06-10 NOTE — Progress Notes (Signed)
Summary: black eyes  Phone Note Call from Patient   Caller: Patient Call For: Gordy Savers  MD Summary of Call: Pt states he had two "black eyes" this week.  Basically, the bruising was in the corner of each eye.  Has had 2 eye surgeries in the past 8 weeks.  Opthamalogist told him not to worry about it.  He has been fighting a sinus infection x 2 weeks, and thinks that may be it.  Advised him  that black eyes usually are not a symptom sinus, and suggested office visit to see if he needs antibiotics.  He declined stating his sinus symptoms are getting better on OTC products.  Instructed to call us back if there is any change in his condition or if he changes his mind about an office visit. Initial call taken by: Lynann Beaver CMA AAMA,  May 14, 2010 9:01 AM  Follow-up for Phone Call        noted Follow-up by: Gordy Savers  MD,  May 14, 2010 12:43 PM

## 2010-06-10 NOTE — Assessment & Plan Note (Signed)
Summary: 3 month fup//ccm   Vital Signs:  Patient profile:   60 year old male Weight:      289 pounds Temp:     98.2 degrees F oral BP sitting:   160 / 90  (left arm) Cuff size:   large  Vitals Entered By: Duard Brady LPN (April 19, 2010 7:55 AM) CC: 3 month rov----- fbs 152 Is Patient Diabetic? Yes   CC:  3 month rov----- fbs 152.  History of Present Illness: a 60 year old patient who is seen today for follow-up of his multiple medical problems.  He has type 2 diabetes and is on mealtime and basal insulin.  Unfortunately, he is gained to almost 40 pounds in weight since the summer.  He has had 3 eye operations, which has really limited his activities. He did have a colonoscopy in July he has hypertension and systolic readings have become higher in the 160 range.  He is on multiple medications for blood pressure control. He remains on Lipitor for dyslipidemia, as well as TriCor.  He is requesting generic substitutions he denies any cardiopulmonary complaints  Allergies (verified): No Known Drug Allergies  Past History:  Past Medical History: Reviewed history from 03/25/2008 and no changes required. Diabetes mellitus, type II Hyperlipidemia Hypertension DJD OSA  11/06 Coronary artery disease morbid obesity diastolic dysfunction Cholelithiasis  Past Surgical History: Tonsillectomy Total knee replacement bilateral; 12/06. 11/05 cardiac catheterization November 2005 showed ejection fraction of 50% with inferior hypokinesis; total proximal RCA with distal vessel filling from left to right collaterals; LAD 60% proximal and 40% mid; left circumflex 50% mid to distal lesion Torn retina 8-11,   colonoscopy 7-11 Cataract extraction 11-11  status post lap band surgery 09-2008  Family History: Reviewed history from 06/01/2007 and no changes required. both parents died in  their mid 40s, both with diabetes and hypertension; father with the peripheral vascular  disease, coronary artery disease  one sister with diabetes  Social History: Reviewed history from 06/28/2007 and no changes required. Married disabled due to severe arthritis  Review of Systems       The patient complains of weight gain and difficulty walking.  The patient denies anorexia, fever, weight loss, vision loss, decreased hearing, hoarseness, chest pain, syncope, dyspnea on exertion, peripheral edema, prolonged cough, headaches, hemoptysis, abdominal pain, melena, hematochezia, severe indigestion/heartburn, hematuria, incontinence, genital sores, muscle weakness, suspicious skin lesions, transient blindness, depression, unusual weight change, abnormal bleeding, enlarged lymph nodes, angioedema, breast masses, and testicular masses.    Physical Exam  General:  morbidly obese.  Blood pressure 144/80 Head:  Normocephalic and atraumatic without obvious abnormalities. No apparent alopecia or balding. Eyes:  No corneal or conjunctival inflammation noted. EOMI. Perrla. Funduscopic exam benign, without hemorrhages, exudates or papilledema. Vision grossly normal. Ears:  External ear exam shows no significant lesions or deformities.  Otoscopic examination reveals clear canals, tympanic membranes are intact bilaterally without bulging, retraction, inflammation or discharge. Hearing is grossly normal bilaterally. Mouth:  Oral mucosa and oropharynx without lesions or exudates.  Teeth in good repair. Neck:  No deformities, masses, or tenderness noted. Lungs:  Normal respiratory effort, chest expands symmetrically. Lungs are clear to auscultation, no crackles or wheezes. Heart:  Normal rate and regular rhythm. S1 and S2 normal without gallop, murmur, click, rub or other extra sounds. Abdomen:  Bowel sounds positive,abdomen soft and non-tender without masses, organomegaly or hernias noted. Msk:  No deformity or scoliosis noted of thoracic or lumbar spine.   Pulses:  R and  L  carotid,radial,femoral,dorsalis pedis and posterior tibial pulses are full and equal bilaterally Extremities:  No clubbing, cyanosis, edema, or deformity noted with normal full range of motion of all joints.   Skin:  Intact without suspicious lesions or rashes Cervical Nodes:  No lymphadenopathy noted   Impression & Recommendations:  Problem # 1:  OBESITY, MORBID (ICD-278.01)  Problem # 2:  HYPERTENSION (ICD-401.9)  The following medications were removed from the medication list:    Amlodipine Besylate 5 Mg Tabs (Amlodipine besylate) ..... One daily His updated medication list for this problem includes:    Carvedilol 25 Mg Tabs (Carvedilol) .Marland Kitchen..Marland Kitchen Two twice daily    Furosemide 40 Mg Tabs (Furosemide) ..... One twice daily as needed    Benazepril Hcl 20 Mg Tabs (Benazepril hcl) ..... One  twice daily    Amlodipine Besylate 10 Mg Tabs (Amlodipine besylate) ..... One daily  The following medications were removed from the medication list:    Amlodipine Besylate 5 Mg Tabs (Amlodipine besylate) ..... One daily His updated medication list for this problem includes:    Carvedilol 25 Mg Tabs (Carvedilol) .Marland Kitchen..Marland Kitchen Two twice daily    Furosemide 40 Mg Tabs (Furosemide) ..... One twice daily as needed    Benazepril Hcl 20 Mg Tabs (Benazepril hcl) ..... One  twice daily    Amlodipine Besylate 10 Mg Tabs (Amlodipine besylate) ..... One daily  Problem # 3:  HYPERLIPIDEMIA (ICD-272.4)  The following medications were removed from the medication list:    Lipitor 80 Mg Tabs (Atorvastatin calcium) .Marland Kitchen... 1 tablet by mouth every night    Tricor 145 Mg Tabs (Fenofibrate) .Marland Kitchen... Take 1 tablet by mouth once a day His updated medication list for this problem includes:    Fenofibrate 160 Mg Tabs (Fenofibrate) ..... One daily    Lipitor 80 Mg Tabs (Atorvastatin calcium) ..... One daily  The following medications were removed from the medication list:    Lipitor 80 Mg Tabs (Atorvastatin calcium) .Marland Kitchen... 1 tablet by  mouth every night    Tricor 145 Mg Tabs (Fenofibrate) .Marland Kitchen... Take 1 tablet by mouth once a day His updated medication list for this problem includes:    Fenofibrate 160 Mg Tabs (Fenofibrate) ..... One daily    Lipitor 80 Mg Tabs (Atorvastatin calcium) ..... One daily  Problem # 4:  DIABETES MELLITUS, TYPE II (ICD-250.00)  His updated medication list for this problem includes:    Aspir-mox 75-325-75 Mg Tabs (Aspirin buff (al hyd-mg hyd)) .Marland Kitchen... Take    Metformin Hcl 1000 Mg Tabs (Metformin hcl) .Marland Kitchen... Take 1 tablet by mouth twice a day    Novolog Flexpen 100 Unit/ml Soln (Insulin aspart) .Marland Kitchen... As directed according to sliding scale  three times a day at meals    Benazepril Hcl 20 Mg Tabs (Benazepril hcl) ..... One  twice daily    Lantus Solostar 100 Unit/ml Soln (Insulin glargine) ..... Injest 12 untis at bedtime  His updated medication list for this problem includes:    Aspir-mox 75-325-75 Mg Tabs (Aspirin buff (al hyd-mg hyd)) .Marland Kitchen... Take    Metformin Hcl 1000 Mg Tabs (Metformin hcl) .Marland Kitchen... Take 1 tablet by mouth twice a day    Novolog Flexpen 100 Unit/ml Soln (Insulin aspart) .Marland Kitchen... As directed according to sliding scale  three times a day at meals    Benazepril Hcl 20 Mg Tabs (Benazepril hcl) ..... One  twice daily    Lantus Solostar 100 Unit/ml Soln (Insulin glargine) ..... Injest 12 untis at bedtime  Orders: Venipuncture (  25366) TLB-A1C / Hgb A1C (Glycohemoglobin) (83036-A1C)  Complete Medication List: 1)  Aspir-mox 75-325-75 Mg Tabs (Aspirin buff (al hyd-mg hyd)) .... Take 2)  Metformin Hcl 1000 Mg Tabs (Metformin hcl) .... Take 1 tablet by mouth twice a day 3)  Carvedilol 25 Mg Tabs (Carvedilol) .... Two twice daily 4)  Furosemide 40 Mg Tabs (Furosemide) .... One twice daily as needed 5)  Klor-con M20 20 Meq Tbcr (Potassium chloride crys cr) .... One twice daily 6)  Lifestyle Lite Test Strips  .... Use four times daily 7)  Novolog Flexpen 100 Unit/ml Soln (Insulin aspart) .... As  directed according to sliding scale  three times a day at meals 8)  Bd Ultra-fine Pen Needles 29g X 12.60mm Misc (Insulin pen needle) .... Used daily 9)  Benazepril Hcl 20 Mg Tabs (Benazepril hcl) .... One  twice daily 10)  Lantus Solostar 100 Unit/ml Soln (Insulin glargine) .... Injest 12 untis at bedtime 11)  Fenofibrate 160 Mg Tabs (Fenofibrate) .... One daily 12)  Lipitor 80 Mg Tabs (Atorvastatin calcium) .... One daily 13)  Amlodipine Besylate 10 Mg Tabs (Amlodipine besylate) .... One daily  Patient Instructions: 1)  Please schedule a follow-up appointment in 3 months. 2)  Limit your Sodium (Salt). 3)  It is important that you exercise regularly at least 20 minutes 5 times a week. If you develop chest pain, have severe difficulty breathing, or feel very tired , stop exercising immediately and seek medical attention. 4)  You need to lose weight. Consider a lower calorie diet and regular exercise.  5)  Check your blood sugars regularly. If your readings are usually above : or below 70 you should contact our office. 6)  It is important that your Diabetic A1c level is checked every 3 months. Prescriptions: AMLODIPINE BESYLATE 10 MG TABS (AMLODIPINE BESYLATE) one daily  #90 x 6   Entered and Authorized by:   Gordy Savers  MD   Signed by:   Gordy Savers  MD on 04/19/2010   Method used:   Faxed to ...       [facility] (mail-order)             , [place]         Ph:        Fax: [phone]   RxID:   [id number] LIPITOR 80 MG TABS (ATORVASTATIN CALCIUM) one daily  #90 x 6   Entered and Authorized by:   Gordy Savers  MD   Signed by:   Gordy Savers  MD on 04/19/2010   Method used:   Faxed to ...       [facility] ([facility])             , [place]         Ph:        Fax: [phone]   RxID:   [id number] LANTUS SOLOSTAR 100 UNIT/ML SOLN (INSULIN GLARGINE) injest 12 untis at bedtime  #90days x 3   Entered and Authorized by:   Gordy Savers  MD   Signed  by:   Gordy Savers  MD on 04/19/2010   Method used:   Faxed to ...       [facility] (mail-order)             , [place]         Ph:        Fax: [phone]   RxID:   [id number] FENOFIBRATE 160 MG TABS (FENOFIBRATE) one  daily  #90 x 6   Entered and Authorized by:   Gordy Savers  MD   Signed by:   Gordy Savers  MD on 04/19/2010   Method used:   Faxed to ...       [facility] (mail-order)             , [place]         Ph:        Fax: [phone]   RxID:   [id number] BENAZEPRIL HCL 20 MG TABS (BENAZEPRIL HCL) one  twice daily  #90 x 3   Entered and Authorized by:   Gordy Savers  MD   Signed by:   Gordy Savers  MD on 04/19/2010   Method used:   Faxed to ...       [facility] (mail-order)             , [place]         Ph:        Fax: [phone]   RxID:   [id number] BD ULTRA-FINE PEN NEEDLES 29G X 12.7MM MISC (INSULIN PEN NEEDLE) used daily  #90 x 3   Entered and Authorized by:   Gordy Savers  MD   Signed by:   Gordy Savers  MD on 04/19/2010   Method used:   Faxed to ...       [facility] (mail-order)             , [place]         Ph:        Fax: [phone]   RxID:   [id number] NOVOLOG FLEXPEN 100 UNIT/ML  SOLN (INSULIN ASPART) as directed according to sliding scale  three times a day at meals  #3 x 3   Entered and Authorized by:   Gordy Savers  MD   Signed by:   Gordy Savers  MD on 04/19/2010   Method used:   Faxed to ...       [facility] (mail-order)             , [place]         Ph:        Fax: [phone]   RxID:   [id number] LIFESTYLE LITE TEST STRIPS use four times daily  #480 x 3   Entered and Authorized by:   Gordy Savers  MD   Signed by:   Gordy Savers  MD on 04/19/2010   Method used:   Faxed to ...       [facility])             , [place]         Ph:        Fax: [phone]   RxID:   [id number] KLOR-CON M20 20 MEQ  TBCR (POTASSIUM CHLORIDE CRYS CR) one twice daily   #180 x 3   Entered and Authorized by:   Gordy Savers  MD   Signed by:   Gordy Savers  MD on 04/19/2010   Method used:   Faxed to ...       [facility] (mail-order)             , [place]         Ph:        Fax: [phone]   RxID:   [id number] FUROSEMIDE 40 MG  TABS (FUROSEMIDE) one twice daily as needed  #180 x 3  Entered and Authorized by:   Gordy Savers  MD   Signed by:   Gordy Savers  MD on 04/19/2010   Method used:   Faxed to ...       [facility] (mail-order)             , [place]         Ph:        Fax: [phone]   RxID:   [id number] CARVEDILOL 25 MG  TABS (CARVEDILOL) two twice daily  #180 x 3   Entered and Authorized by:   Gordy Savers  MD   Signed by:   Gordy Savers  MD on 04/19/2010   Method used:   Faxed to ...       [facility] (mail-order)             , [place]         Ph:        Fax: [phone]   RxID:   [id number] METFORMIN HCL 1000 MG TABS (METFORMIN HCL) Take 1 tablet by mouth twice a day  #180 x 3   Entered and Authorized by:   Gordy Savers  MD   Signed by:   Gordy Savers  MD on 04/19/2010   Method used:   Faxed to ...       [facility] (mail-order)             , [place]         Ph:        Fax: [phone]   RxID:   [id number]    Orders Added: 1)  Est. Patient Level IV [01093] 2)  Venipuncture [23557] 3)  TLB-A1C / Hgb A1C (Glycohemoglobin) [83036-A1C]     Appended Document: Orders Update     Clinical Lists Changes  Orders: Added new Service order of Specimen Handling (32202) - Signed

## 2010-06-30 ENCOUNTER — Encounter: Payer: Self-pay | Admitting: Internal Medicine

## 2010-06-30 ENCOUNTER — Ambulatory Visit (INDEPENDENT_AMBULATORY_CARE_PROVIDER_SITE_OTHER): Payer: Medicare Other | Admitting: Internal Medicine

## 2010-06-30 DIAGNOSIS — I2510 Atherosclerotic heart disease of native coronary artery without angina pectoris: Secondary | ICD-10-CM

## 2010-06-30 DIAGNOSIS — E785 Hyperlipidemia, unspecified: Secondary | ICD-10-CM

## 2010-06-30 DIAGNOSIS — E119 Type 2 diabetes mellitus without complications: Secondary | ICD-10-CM

## 2010-06-30 DIAGNOSIS — I10 Essential (primary) hypertension: Secondary | ICD-10-CM

## 2010-06-30 MED ORDER — FENOFIBRATE 160 MG PO TABS: 160.0000 mg | ORAL_TABLET | Freq: Every day | ORAL | Status: DC

## 2010-06-30 MED ORDER — BENAZEPRIL HCL 20 MG PO TABS: 20.0000 mg | ORAL_TABLET | Freq: Two times a day (BID) | ORAL | Status: DC

## 2010-06-30 MED ORDER — ATORVASTATIN CALCIUM 80 MG PO TABS: 80.0000 mg | ORAL_TABLET | Freq: Every day | ORAL | Status: DC

## 2010-06-30 MED ORDER — AMLODIPINE BESYLATE 10 MG PO TABS: 10.0000 mg | ORAL_TABLET | Freq: Every day | ORAL | Status: DC

## 2010-06-30 MED ORDER — INSULIN ASPART 100 UNIT/ML ~~LOC~~ SOLN: SUBCUTANEOUS | Status: DC

## 2010-06-30 MED ORDER — FUROSEMIDE 40 MG PO TABS: 40.0000 mg | ORAL_TABLET | Freq: Two times a day (BID) | ORAL | Status: DC | PRN

## 2010-06-30 MED ORDER — METFORMIN HCL 1000 MG PO TABS: 1000.0000 mg | ORAL_TABLET | Freq: Two times a day (BID) | ORAL | Status: DC

## 2010-06-30 MED ORDER — POTASSIUM CHLORIDE CRYS ER 20 MEQ PO TBCR: 20.0000 meq | EXTENDED_RELEASE_TABLET | Freq: Two times a day (BID) | ORAL | Status: DC

## 2010-06-30 MED ORDER — DOXYCYCLINE HYCLATE 100 MG PO TABS: 100.0000 mg | ORAL_TABLET | Freq: Two times a day (BID) | ORAL | Status: DC

## 2010-06-30 MED ORDER — GLUCOSE BLOOD VI STRP: ORAL_STRIP | Status: DC

## 2010-06-30 MED ORDER — DOXYCYCLINE HYCLATE 100 MG PO TABS: 100.0000 mg | ORAL_TABLET | Freq: Two times a day (BID) | ORAL | Status: AC

## 2010-06-30 MED ORDER — CARVEDILOL 25 MG PO TABS: 25.0000 mg | ORAL_TABLET | Freq: Two times a day (BID) | ORAL | Status: DC

## 2010-06-30 MED ORDER — INSULIN GLARGINE 100 UNIT/ML ~~LOC~~ SOLN: 12.0000 [IU] | Freq: Every day | SUBCUTANEOUS | Status: DC

## 2010-06-30 NOTE — Patient Instructions (Signed)
Limit your sodium (Salt) intake You need to lose weight.  Consider a lower calorie diet and regular exercise.  Please check your hemoglobin A1c every 3 months    It is important that you exercise regularly, at least 20 minutes 3 to 4 times per week.  If you develop chest pain or shortness of breath seek  medical attention.  Return in 3 months for follow-up

## 2010-06-30 NOTE — Progress Notes (Signed)
  Subjective:    Patient ID: Matthew [name], male    DOB: 1950/06/01, 60 y.o.   MRN: [id number]  HPI   60 year old patient who is seen today for followup of his multiple medical problems. He has coronary artery disease which has been stable.  He has type 2 diabetes and his hemoglobin A1c has trended up over the past several visits. 2 months ago his hemoglobin A1c was 7.3. He is status post lap band surgery but has had a weight gain  and dietary indiscretion. He has treated hypertension which has been the controlled on multiple drugs including amlodipine. He has had some lower extremity edema.  He has dyslipidemia controlled on Lipitor he continues to tolerate this medication well  For the past several days he has had swelling pain and redness involving the left nose and facial area    Review of Systems  Constitutional: Negative for fever, chills, appetite change and fatigue.  HENT: Negative for hearing loss, ear pain, congestion, sore throat, trouble swallowing, neck stiffness, dental problem, voice change and tinnitus.   Eyes: Negative for pain, discharge and visual disturbance.  Respiratory: Negative for cough, chest tightness, wheezing and stridor.   Cardiovascular: Negative for chest pain, palpitations and leg swelling.  Gastrointestinal: Negative for nausea, vomiting, abdominal pain, diarrhea, constipation, blood in stool and abdominal distention.  Genitourinary: Negative for urgency, hematuria, flank pain, discharge, difficulty urinating and genital sores.  Musculoskeletal: Negative for myalgias, back pain, joint swelling, arthralgias and gait problem.  Skin: Positive for rash.  Neurological: Negative for dizziness, syncope, speech difficulty, weakness, numbness and headaches.  Hematological: Negative for adenopathy. Does not bruise/bleed easily.  Psychiatric/Behavioral: Negative for behavioral problems and dysphoric mood. The patient is not nervous/anxious.        Objective:   Physical Exam  Constitutional: He is oriented to person, place, and time. He appears well-developed.        obese  HENT:  Head: Normocephalic.  Right Ear: External ear normal.  Left Ear: External ear normal.  Eyes: Conjunctivae and EOM are normal.  Neck: Normal range of motion.  Cardiovascular: Normal rate and normal heart sounds.   Pulmonary/Chest: Breath sounds normal.  Abdominal: Bowel sounds are normal.  Musculoskeletal: Normal range of motion. He exhibits no edema and no tenderness.  Neurological: He is alert and oriented to person, place, and time.  Skin: Rash noted.        The left side of his nose was erythematous and slightly swollen this extended slightly into the facial area   Psychiatric: He has a normal mood and affect. His behavior is normal.          Assessment & Plan:    Facial cellulitis. We'll treat with 10 days of doxycycline.   Diabetes mellitus suboptimal control  Hypertension stable  exogenous obesity weight loss exercise better eating habits all encouraged

## 2010-07-19 ENCOUNTER — Ambulatory Visit: Payer: Self-pay | Admitting: Internal Medicine

## 2010-07-23 LAB — CBC
HCT: 38.8 % — ABNORMAL LOW (ref 39.0–52.0)
Hemoglobin: 12.7 g/dL — ABNORMAL LOW (ref 13.0–17.0)
MCH: 27.4 pg (ref 26.0–34.0)
MCHC: 32.7 g/dL (ref 30.0–36.0)
MCV: 83.8 fL (ref 78.0–100.0)
RBC: 4.63 MIL/uL (ref 4.22–5.81)

## 2010-07-23 LAB — GLUCOSE, CAPILLARY
Glucose-Capillary: 143 mg/dL — ABNORMAL HIGH (ref 70–99)
Glucose-Capillary: 161 mg/dL — ABNORMAL HIGH (ref 70–99)

## 2010-07-23 LAB — COMPREHENSIVE METABOLIC PANEL
ALT: 18 U/L (ref 0–53)
AST: 26 U/L (ref 0–37)
CO2: 30 mEq/L (ref 19–32)
Calcium: 9.4 mg/dL (ref 8.4–10.5)
Chloride: 107 mEq/L (ref 96–112)
Creatinine, Ser: 1.11 mg/dL (ref 0.4–1.5)
GFR calc Af Amer: >60 mL/min (ref >60)
GFR calc non Af Amer: >60 mL/min (ref >60)
Glucose, Bld: 176 mg/dL — ABNORMAL HIGH (ref 70–99)
Total Bilirubin: 0.4 mg/dL (ref 0.3–1.2)

## 2010-07-23 LAB — SURGICAL PCR SCREEN: MRSA, PCR: NEGATIVE

## 2010-08-19 LAB — GLUCOSE, CAPILLARY
Glucose-Capillary: 111 mg/dL — ABNORMAL HIGH (ref 70–99)
Glucose-Capillary: 111 mg/dL — ABNORMAL HIGH (ref 70–99)
Glucose-Capillary: 149 mg/dL — ABNORMAL HIGH (ref 70–99)
Glucose-Capillary: 156 mg/dL — ABNORMAL HIGH (ref 70–99)
Glucose-Capillary: 160 mg/dL — ABNORMAL HIGH (ref 70–99)

## 2010-08-19 LAB — COMPREHENSIVE METABOLIC PANEL
Alkaline Phosphatase: 50 U/L (ref 39–117)
BUN: 25 mg/dL — ABNORMAL HIGH (ref 6–23)
Chloride: 102 mEq/L (ref 96–112)
Creatinine, Ser: 1.34 mg/dL (ref 0.4–1.5)
Glucose, Bld: 127 mg/dL — ABNORMAL HIGH (ref 70–99)
Potassium: 5.1 mEq/L (ref 3.5–5.1)
Total Bilirubin: 0.8 mg/dL (ref 0.3–1.2)

## 2010-08-19 LAB — DIFFERENTIAL
Basophils Absolute: 0.1 10*3/uL (ref 0.0–0.1)
Basophils Relative: 1 % (ref 0–1)
Basophils Relative: 1 % (ref 0–1)
Eosinophils Relative: 1 % (ref 0–5)
Monocytes Absolute: 0.6 10*3/uL (ref 0.1–1.0)
Monocytes Relative: 9 % (ref 3–12)
Neutro Abs: 4.8 10*3/uL (ref 1.7–7.7)
Neutro Abs: 5.5 10*3/uL (ref 1.7–7.7)
Neutrophils Relative %: 71 % (ref 43–77)

## 2010-08-19 LAB — CBC
HCT: 33.5 % — ABNORMAL LOW (ref 39.0–52.0)
HCT: 38.2 % — ABNORMAL LOW (ref 39.0–52.0)
Hemoglobin: 11.1 g/dL — ABNORMAL LOW (ref 13.0–17.0)
Hemoglobin: 12.9 g/dL — ABNORMAL LOW (ref 13.0–17.0)
MCHC: 33.2 g/dL (ref 30.0–36.0)
MCV: 83.1 fL (ref 78.0–100.0)
RBC: 3.98 MIL/uL — ABNORMAL LOW (ref 4.22–5.81)
RDW: 16 % — ABNORMAL HIGH (ref 11.5–15.5)
WBC: 7.7 10*3/uL (ref 4.0–10.5)

## 2010-09-16 ENCOUNTER — Telehealth: Payer: Self-pay | Admitting: Internal Medicine

## 2010-09-16 MED ORDER — BENAZEPRIL HCL 20 MG PO TABS: 20.0000 mg | ORAL_TABLET | Freq: Two times a day (BID) | ORAL | Status: DC

## 2010-09-16 NOTE — Telephone Encounter (Signed)
efilled to [facility]

## 2010-09-16 NOTE — Telephone Encounter (Signed)
Refill Benazepril 20mg  bid to [facility] until mail order arrives. Going out of town tomorrow.

## 2010-09-21 NOTE — Op Note (Signed)
Matthew [name], LIBURD                ACCOUNT NO.:  [id number]   MEDICAL RECORD NO.:  [id number]          PATIENT TYPE:  AMB   LOCATION:  DAY                          FACILITY:  [facility]   PHYSICIAN:  Sandria Bales. Ezzard Standing, M.D.  DATE OF BIRTH:  [birth year]   DATE OF PROCEDURE:  07/22/2008  DATE OF DISCHARGE:                               OPERATIVE REPORT   Date of Surgery ?   PREOPERATIVE DIAGNOSES:  Morbid obesity, weight 213, BMI 47.7.   POSTOPERATIVE DIAGNOSES:  Morbid obesity, weight 213, BMI o47.   PROCEDURE:  Esophagogastroscopy   SURGEON:  Sandria Bales. Ezzard Standing, M.D.   FIRST ASSISTANT:  None.   ANESTHESIA:  General tracheal.   ESTIMATED BLOOD LOSS:  Minimal.   INDICATIONS FOR PROCEDURE:  Mr. Esterline is undergoing a laparoscopic  banding procedure for morbid obesity by Dr. Wenda Low.  He has a  moderate hiatal hernia, has a lipoma at his hiatal hernia and I am doing  an upper endoscopy both to document the location of the gastroesophageal  junction and make sure there is no esophageal injury.   PROCEDURE NOTE:  The patient in supine position, Dr. Daphine Deutscher manning the  endoscope,  I passed a flexible Olympus endoscope down the esophagus  without difficulty.   He has a tight cricopharyngeus.  I was able to advance the scope into  the stomach.  On preop upper GI there is a suggestion of a distal  esophageal stricture, but I did not identify this during the endoscopy.  The Z-line was about 40-41 cm, was widely patent.  The stomach itself  was otherwise unremarkable.  I confirmed the location of the  gastroesophageal junction.  There was no evidence of any leak from the  esophagus or stomach.  Scope was then withdrawn.  The esophagus itself  was unremarkable.  The patient tolerated the procedure well.  Dr. Daphine Deutscher  will dictate the main body of the laparoscopic band procedure.      Sandria Bales. Ezzard Standing, M.D.  Electronically Signed     DHN/MEDQ  D:  07/22/2008  T:  07/23/2008  Job:   [id number]   cc:   [place] Daphine Deutscher, MD  [address], [address]  [place]  [place] [address]

## 2010-09-21 NOTE — Op Note (Signed)
NAMESHAWNA, KIENER                ACCOUNT NO.:  [id number]   MEDICAL RECORD NO.:  [id number]          PATIENT TYPE:  AMB   LOCATION:  DAY                          FACILITY:  [facility]   PHYSICIAN:  Thornton Park. Daphine Deutscher, MD  DATE OF BIRTH:  03-10-1951   DATE OF PROCEDURE:  07/22/2008  DATE OF DISCHARGE:                               OPERATIVE REPORT   PREOPERATIVE DIAGNOSIS:  Morbid obesity, body mass index of 48.   PROCEDURE:  Laparoscopic adjustable gastric band (APL), reduction and  repair of sliding hiatal hernia with three-suture posterior closure of  the hiatus, upper endoscopy per Dr. Ezzard Standing, placement of the lap band  port subcutaneously on the right side.   SURGEON:  Luretha Murphy, M.D.   ASSISTANT:  Ovidio Kin, M.D.   ANESTHESIA:  General endotracheal.   DESCRIPTION OF PROCEDURE:  Mr. Macgowan is a 60 year old gentleman who was  brought to OR 1 at [facility] on July 22, 2008.  The abdomen was prepped with a Techni-Care equivalent and draped  sterilely.  Access to the abdomen was achieved, going through the left  upper quadrant, using OptiVu and a 12-trocar, 0-degree telescope.  The  abdomen was entered without difficulty and insufflated.  Standard  trocars were placed, including five the upper midline for the [facility]  retractor, which was affixed to the bed with an iron intern.  A 15 was  placed on the right side in the upper position and a 12 in the lower  position.   The dissection was then begun of the foregut.  Strikingly, he had a  large amount of fat there and an obvious sliding hiatal hernia with a  little sac.  I elected to go ahead and repair that as I thought it was  sizeable, and dissected the hiatus at that the EG junction.  Once I did  this and got posteriorly, I found a fairly sizeable hiatal hernia and  not only that, I reduced a large fatty hernia that had gone up into the  chest and was a part of this and brought all that down into  the abdomen  and ultimately resected it.   Meantime, I placed three sutures, using the Endostitch posteriorly to  close the hiatus and then Dr. Ezzard Standing endoscoped the patient to verify  anatomy and look at everything and everything appeared to be in order.   Once this was completed, I then used the band passer to go a little  below the hiatal repair and go up around the stomach.  The APL band was  introduced.  I used the silk stitch on the tail and brought it through  the band passer and brought it around and caged it in the buckle.  It  was snapped in place and then plicated with three free stitches of  Surgidac and tie knots.  These were tied snug but not too tight, for a  nice plication.   The band port was then established in the right lower quadrant and the  port had mesh placed in the back of it and it was  placed in the  subcutaneous position and then the wounds were closed with 4-0 Vicryl.  The patient tolerated procedure well, was taken to the recovery room in  satisfactory condition.      Th[place] Park Daphine Deutscher, MD  Electronically Signed     MBM/MEDQ  D:  07/22/2008  T:  07/23/2008  Job:  [id number]   cc:   Gordy Savers, MD  [address] [place]  [place] [address]   Bevelyn Buckles. Bensimhon, MD  [address], [place] [address]   Ollen Gross, M.D.  Fax: [phone]

## 2010-09-24 NOTE — Op Note (Signed)
NAMEARLAN, [name]                ACCOUNT NO.:  [id number]   MEDICAL RECORD NO.:  [id number]          PATIENT TYPE:  INP   LOCATION:  H846                         FACILITY:  [facility]   PHYSICIAN:  Ollen Gross, M.D.    DATE OF BIRTH:  [birth year]   DATE OF PROCEDURE:  04/11/2005  DATE OF DISCHARGE:                                 OPERATIVE REPORT   PREOPERATIVE DIAGNOSIS:  Osteoarthritis, right knee.   POSTOPERATIVE DIAGNOSIS:  Osteoarthritis, right knee.   PROCEDURE:  Right total knee arthroplasty.   SURGEON:  Dr. Lequita Halt   ASSISTANT:  Avel Peace, PA-C   ANESTHESIA:  General with postop Marcaine pain pump.   ESTIMATED BLOOD LOSS:  Minimal.   DRAIN:  Hemovac x 1.   TOURNIQUET TIME:  46 minutes at 300 mmHg.   COMPLICATIONS:  None.   CONDITION:  Stable to recovery.   BRIEF CLINICAL NOTE:  Matthew [name] is a 60 year old male with severe end-stage  osteoarthritis of the right knee with intractable pain.  He has had a  previous successful left total knee arthroplasty, presents now for right  total knee arthroplasty.   PROCEDURE IN DETAIL:  After the successful administration of general  anesthetic, a tourniquet is placed high on the right thigh and right lower  extremity prepped and draped in the usual sterile fashion.  Extremity is  wrapped in Esmarch, knee flexed, tourniquet inflated to 300 mmHg.  A  standard midline incision is made with a 10 blade through subcutaneous  tissue to the level of the extensor mechanism.  A fresh blade is used to  make a medial parapatellar arthrotomy.  Soft tissue over the proximal and  medial tibia is subperiosteally elevated to the joint line with a knife and  into the semimembranosus bursa with a Cobb elevator.  Soft tissue over the  proximal and lateral tibia is elevated with attention being paid to avoiding  the patellar tendon on tibial tubercle.  The patella is everted, knee flexed  90 degrees and ACL and PCL are removed.  Drill is used to  create a starting  hole in the distal femur, and canal is irrigated.  A 5-degree right valgus  alignment guide is placed and referencing off the posterior condyles,  rotation is marked and a block pinned to remove 11 mm off the distal femur.  We took a little extra because of his flexion contracture.  Distal femoral  resection is made with an oscillating saw, and a sizing block placed.  The  size 4 is the most appropriate.  Rotation is marked at the epicondylar axis,  and the size 4 cutting block is placed.  The anterior, posterior, and  chamfer cuts are subsequently made.   Tibia is subluxed forward, and the menisci are removed.  Extramedullary  tibial alignment guide is placed, referencing proximally at the medial  aspect of the tibial tubercle and distally along the second metatarsal axis  and tibial crest.  The block is pinned to remove 10 mm off the nondeficient  lateral side.  Tibial resection is made with an oscillating saw.  Size  4 is  the most appropriate tibial component, and then the proximal tibia is  prepared with the modular drill and keel punch for a size 4.  Femoral  preparation is completed with the intercondylar cut for the size 4.   The size 4 mobile bearing tibial trial with the size 4 posterior stabilized  femoral trial, and a 10 mm posterior stabilized rotating platform insert  trial are placed.  With the 10, full extension is achieved with excellent  varus and valgus balance throughout full range of motion.  Patella is then  everted and thickness measured to be 22 mm.  Freehand resection is taken to  11 mm.  A 41 template is placed; lug holes are drilled; trial patellar is  placed, and it tracks normally.  The osteophytes are then removed off the  posterior femur with the trials in place.  All trials are removed, and the  cut bone surfaces are prepared with pulsatile lavage.  Cement is mixed and  once ready for implantation, the size 4 mobile bearing tibial tray,  size 4  posterior stabilized femur, and 41 patella are cemented into place, and the  patella is held with a clamp.  Trial 10 mm insert is placed and knee held in  full extension and all extruded cement removed.  Once the cement is fully  hardened, then the permanent 10 mm posterior stabilized rotating platform  insert is placed into the tibial tray.  The wound is copiously irrigated  with saline solution and the extensor mechanism closed over a Hemovac drain  with interrupted #1 PDS.  Flexion against gravity is 135 degrees.  Tourniquet is released for a total time of 46 minutes.  The subcu is closed  with interrupted 2-0 Vicryl and subcuticular running 4-0 Monocryl.  The  catheter for the Marcaine pain pump is placed, and the pump is initiated.  The Hemovac is hooked to suction, and then Steri-Strips and a bulky sterile  dressing are applied.  He is placed into a knee immobilizer, awakened, and  transported to recovery in stable condition.      Ollen Gross, M.D.  Electronically Signed     FA/MEDQ  D:  04/11/2005  T:  04/11/2005  Job:  [id number]

## 2010-09-24 NOTE — H&P (Signed)
Matthew [name], Matthew [name]                ACCOUNT NO.:  [id number]   MEDICAL RECORD NO.:  [id number]          PATIENT TYPE:  INP   LOCATION:  NA                           FACILITY:  [facility]   PHYSICIAN:  Ollen Gross, M.D.    DATE OF BIRTH:  [birth year]   DATE OF ADMISSION:  04/11/2005  DATE OF DISCHARGE:                                HISTORY & PHYSICAL   DATE OF OFFICE VISIT HISTORY AND PHYSICAL:  April 07, 2005.   CHIEF COMPLAINT:  Right knee pain.   HISTORY OF PRESENT ILLNESS:  A 60 year old male seen by Dr. Lequita Halt for  ongoing knee pain.  He has previously undergone a left total knee  arthroplasty which has done quite well.  He has reached the point now where  he would like to have the right knee done.  He has known osteoarthritis.  He  has done well with the previous knee and felt to be a good candidate.  Risks  and benefits are discussed.  The patient is subsequently admitted to the  hospital.   ALLERGIES:  No known drug allergies.   CURRENT MEDICATIONS:  1.  Lotrel 10/20 p.o. q.a.m.  2.  Actos 45 mg p.o. q.a.m.  3.  Hydrochlorothiazide 25 mg p.o. q.a.m.  4.  Detrol LA 4 mg p.o. q.a.m.  5.  Furosemide 40 mg p.o. q.a.m.  6.  Potassium chloride XR 20 mEq one p.o. q.a.m.  7.  Tricor 145 mg one p.o. q.a.m.  8.  Terazosin 10 mg one p.o. q.p.m.  9.  Glyburide micronize 6 mg tablets twice a day.  10. Metformin 1000 mg twice a day.  11. Lipitor 80 mg one p.o. nightly.  12. Lantus 100 units per mL, 50 units at bedtime.  13. Coreg 50 mg two twice a day.   PAST MEDICAL HISTORY:  1.  Diabetes mellitus.  2.  Hypertension.  3.  Acid reflux.  4.  Hypercholesterolemia.  5.  Urinary urgency.  6.  Peripheral edema.  7.  Sleep apnea.   PAST SURGICAL HISTORY:  Left total knee arthroplasty, tonsillectomy, left  knee arthroscopy, hernia repair.   FAMILY HISTORY:  Father deceased at age 14 with diabetes, hypertension,  heart failure, and renal failure.  Mother is deceased at age 73  with  diabetes, hypertension, and renal failure.  Has a sister at age 54 with  diabetes.   SOCIAL HISTORY:  Married, two grown children.  Denies the use of tobacco  products, quit about 30 years ago.  Seldom intake of alcohol.  Disabled.   REVIEW OF SYSTEMS:  GENERAL:  No fevers, chills, or night sweats.  NEUROLOGY:  No seizures, syncope, or paralysis.  RESPIRATORY:  No shortness  of breath, productive cough, or hemoptysis.  CARDIOVASCULAR:  No chest pain,  angina, or orthopnea.  GASTROINTESTINAL:  No nausea, vomiting, diarrhea, or  constipation.  GENITOURINARY:  No dysuria, hematuria, or discharge.  MUSCULOSKELETAL:  Right knee found in the history of present illness.   PHYSICAL EXAMINATION:  VITAL SIGNS:  Pulse 64, respirations 14, blood  pressure 130/78.  GENERAL:  A  60 year old white male, well-nourished, well-developed, alert,  oriented, and cooperative.  Overweight, obese, in no acute distress.  HEENT:  Normocephalic and atraumatic.  Pupils equal, round, and reactive to  light.  Oropharynx clear.  EOM's intact.  Upper and lower denture plates.  NECK:  Supple.  CHEST:  Clear.  Does have distant breath sounds, however.  No rhonchi,  rales, or wheezing.  HEART:  Regular rhythm.  No murmurs.  S1 and S2 noted.  ABDOMEN:  Soft, large, round, protuberant abdomen.  Bowel sounds are  present.  RECTAL:  BREASTS:  GENITOURINARY:  Not done, not pertinent to present  illness.  EXTREMITIES:  Right knee has moderate crepitus noted on passive range of  motion.  There is no instability.  Very tender to palpation.   IMPRESSION:  1.  Osteoarthritis, right knee.  2.  Diabetes mellitus.  3.  Hypertension.  4.  Acid reflux.  5.  Hypercholesterolemia.  6.  Urinary urgency.  7.  Peripheral edema.  8.  Sleep apnea.   PLAN:  The patient is admitted to [facility] to undergo  right total knee arthroplasty.  Surgery will be performed by Dr. Lequita Halt.  Anesthesia has recommended  Glucomander postoperatively.  We will consult  Gordy Savers, M.D. [facility] to assist with medical management and  recommendations on Glucometer protocol.      Alexzandrew L. Julien Girt, P.A.      Ollen Gross, M.D.  Electronically Signed    ALP/MEDQ  D:  04/10/2005  T:  04/11/2005  Job:  [id number]   cc:   Gordy Savers, M.D. [facility]  [address] [place]  [place] [address]

## 2010-09-24 NOTE — Procedures (Signed)
NAMEAMALIO, LOE                ACCOUNT NO.:  [id number]   MEDICAL RECORD NO.:  [id number]          PATIENT TYPE:  OUT   LOCATION:  SLEEP CENTER                 FACILITY:  [facility]   PHYSICIAN:  Marcelyn Bruins, M.D. [facility] DATE OF BIRTH:  19-Aug-1950   DATE OF STUDY:  04/05/2005                              NOCTURNAL POLYSOMNOGRAM   REFERRING PHYSICIAN:  Leslye Peer, M.D.   DATE OF STUDY:  April 05, 2005.   INDICATION FOR STUDY:  Hypersomnia with sleep apnea.   EPWORTH SLEEPINESS SCORE:  7.   SLEEP ARCHITECTURE:  The patient had a total sleep time of 256 minutes with  very little slow wave sleep and REM. Sleep onset latency was normal at 15  minutes and REM onset was mildly prolonged at 146 minutes. Sleep efficiency  was decreased at 69%.   RESPIRATORY DATA:  The patient was found to have large numbers of  obstructive events which gave him a respiratory disturbance index of 87  events per hour. It should be noted the sleep technician scored both  obstructive and central events, however, most of these central events are in  actuality mixed events. Events were not positional but there was very loud  snoring noted.   OXYGEN DATA:  There was O2 desaturation as low as 73% with the obstructive  events.   CARDIAC DATA:  No clinically significant cardiac arrhythmia.   MOVEMENT/PARASOMNIA:  None.   IMPRESSION/RECOMMENDATIONS:  Severe obstructive sleep apnea/hypopnea  syndrome with a respiratory disturbance index of 87 events per hour and O2  desaturation as low as 73%. Again, the technician did score many central  events that in actuality were primarily mixed events. There were some true  central apneas noted in this grouping. Treatment for this degree of sleep  apnea should focus primarily on weight loss coupled with C-PAP.           ______________________________  Marcelyn Bruins, M.D. [facility]  Diplomate, American Board of Sleep  Medicine    KC/MEDQ  D:  04/13/2005 17:05:44  T:   04/13/2005 22:32:23  Job:  [id number]

## 2010-09-24 NOTE — Discharge Summary (Signed)
Matthew [name], Matthew [name]                ACCOUNT NO.:  [id number]   MEDICAL RECORD NO.:  [id number]          PATIENT TYPE:  INP   LOCATION:  0471                         FACILITY:  [name]   PHYSICIAN:  Matthew [name], M.D.    DATE OF BIRTH:  1950-11-28   DATE OF ADMISSION:  05/04/2004  DATE OF DISCHARGE:  05/07/2004                                 DISCHARGE SUMMARY   ADMISSION DIAGNOSES:  1.  Osteoarthritis, left knee.  2.  Coronary arterial disease, documented by heart catheterization.  3.  Diabetes mellitus.  4.  Hypertension.  [name].  Occasional acid reflux.  6.  Hypercholesterolemia.  7.  Urinary urgency.  8.  Peripheral edema.   DISCHARGE DIAGNOSES:  1.  Osteoarthritis, left knee, status post total knee arthroplasty, computer-      navigation assisted.  2.  Mild postoperative blood-loss anemia.  3.  Coronary arterial disease, documented by heart catheterization.  4.  Diabetes mellitus.  [name].  Hypertension.  6.  Occasional acid reflux.  7.  Hypercholesterolemia.  8.  Urinary urgency.  9.  Peripheral edema.  10. Postoperative acute renal insufficiency.   PROCEDURE:  On May 04, 2004, left total knee arthroplasty, computer  navigation.   SURGEON:  Matthew [name], M.D.   ASSISTANT:  Matthew [name], P.A.-C.   ANESTHESIA:  General.   BLOOD LOSS:  300 cc.   DRAINS:  Hemovac x1.   TOURNIQUET TIME:  Fifty-nine minutes at 300 mmHg.   CONSULTS:  Matthew [name].   BRIEF HISTORY:  Matthew [name] is a 60 year old male with severe end-stage arthritis  of both knees, left more symptomatic than the right.  He has failed  nonoperative management, including injections.  He now presents for a total  knee arthroplasty.   LABORATORY DATA:  CBC preop showed a hemoglobin of 12.8 and hematocrit of  38.  White cell count 7.3 with a normal differential.  Postop hemoglobin  10.8.  Last noted H&H 8.9 and 26.7.  PT/PTT preop 12.2 and 20, respectively  with an INR of 0.9.  Serial pro  times followed.  The last noted PT/INR of  19.8 and 2.1.  Chem panel on admission:  Elevated glucose of 209.  Elevated  CO2 of 34.  The remaining chem panel within normal limits.  Serial BMETs  were followed.  The sodium did drop from 141 down to 131, back up to 137.  Glucose went down from 209 to 174 back up to 269.  BUN went up from 13 to  38, back down to 22.  Creatinine went up from 1.1 to 1.8, back down to 1.2.  B-type natriuretic peptide ordered, 50.2.  Urinalysis preop negative.  Blood  group type O-.   Chest x-ray on May 05, 2004:  Suboptimal inspiration.  No evidence of  acute disease.   HOSPITAL COURSE:  Patient was admitted to [name] , taken to  the OR, and underwent the above-stated procedure without complications.  The  patient tolerated the procedure well and was later sent to the recovery room  and to the orthopedic floor.  Started  on PCA and p.o. analgesics for pain  control.  A cardiology consult was called postoperatively.  Internal  [name] was also consultation, Matthew [name].  He did have some discomfort but  was tolerating pain fairly well.  A Hemovac placed at the time of surgery  was pulled on day #1.  He did have a little bit of fluid/volume overload.  Fluids were reduced.  He also had some mild azotemia.  BUN and creatinine  went up.  He did have a bump in his creatinine and was seen by [name], who  felt that it was prerenal.  Chest x-ray was negative.  Hydrochlorothiazide  and Lasix were held.   By day #2, he was doing much better.  His pain was under better control.  He  had already been up to the bathroom.  He was starting to diurese his fluids.  PCA and Foley was discontinued.  The renal insufficiency was, again, felt to  be prerenal, and he was given more IV fluids.   From an orthopedic standpoint, he continued to improve, and by May 07, 2004, he had been up ambulating with the therapist.  He was tolerating his  p.o. meds.  He was  anxious to go home.  He had been seen by [name].  He  was improving from his renal insufficiency.  They recommended followup with  his primary medical doctor in 2-3 weeks.  He was seen by Dr. Despina [name] and was  discharged home.   DISCHARGE PLAN:  1.  Patient was discharged home on May 07, 2004.  2.  Discharge diagnoses:  Please see above.  3.  Discharge meds:  Percocet, Robaxin, Coumadin.  4.  Diet:  Low sodium diet, diabetic diet.  [name].  Activity:  Weightbearing as tolerated.  Home health PT/OT and home      health nursing.  Total knee protocol.  6.  Follow up in two weeks from surgery.   DISPOSITION:  Home.   CONDITION ON DISCHARGE:  Improved.      ALP/MEDQ  D:  06/16/2004  T:  06/16/2004  Job:  [id number]   cc:   Matthew [name], M.D. Matthew [name]   Matthew [name], M.D. Matthew [name]

## 2010-09-24 NOTE — Discharge Summary (Signed)
Matthew [name], Matthew [name]                ACCOUNT NO.:  [id number]   MEDICAL RECORD NO.:  [id number]          PATIENT TYPE:  INP   LOCATION:  1509                         FACILITY:  [facility]   PHYSICIAN:  Ollen Gross, M.D.    DATE OF BIRTH:  07-01-1950   DATE OF ADMISSION:  04/11/2005  DATE OF DISCHARGE:  04/14/2005                                 DISCHARGE SUMMARY   ADMISSION DIAGNOSES:  1.  Osteoarthritis, right knee.  2.  Diabetes mellitus.  3.  Hypertension.  4.  Acid reflux.  5.  Hypercholesterolemia.  6.  Urinary urgency.  7.  Peripheral edema.  8.  Sleep apnea.   DISCHARGE DIAGNOSES:  1.  Osteoarthritis, right knee, status post right total knee arthroplasty.  2.  Mild postoperative blood loss anemia.  3.  Postoperative hyponatremia, improving.  4.  Osteoarthritis, right knee.  5.  Diabetes mellitus.  6.  Hypertension.  7.  Acid reflux.  8.  Hypercholesterolemia.  9.  Urinary urgency.  10. Peripheral edema.  11. Sleep apnea.   PROCEDURE:  On April 11, 2005, osteoarthritis of the right knee, status  post right total knee arthroplasty.   SURGEON:  Ollen Gross, M.D.   ASSISTANT:  Alexzandrew L. Perkins, P.A.-C.   ANESTHESIA:  General with postoperative Marcaine pain pump.   TOURNIQUET TIME:  Forty-six minutes at 300 mmHg.   CONSULTS:  Internal medicine, hospitalist, Dr. Rene Paci, with  [facility].   BRIEF HISTORY:  Matthew [name] is a 60 year old male with severe end-stage arthritis  of the right knee with intractable pain.  Had previous successful left total  knee, now presents for right total knee arthroplasty.   LABORATORY DATA:  Preop CBC reveals hemoglobin slightly low at 11.6,  hematocrit 35, white cell count 6.5.  Postop hemoglobin 10.3.  Last noted  H&H dropped down to 8.5 and 24.5.  PT/PTT preop 13 and 25, respectively.  INR 1.  Serial pro times followed.  Last noted PT/INR 25.9 and 2.4.  Chem  panel on admission, elevated glucose of 250.  Elevated BUN of  26.  Total  protein 5.7.  Low albumin 3.2.  Remaining chem panel within normal limits.  Serial BMETs are followed.  Glucose came down to a normal level at 94, back  up to 275, then back down to 134.  Sodium dropped from 141 down to 133, back  up to 134.  The remaining electrolytes remained within normal limits.  Preop  UA negative.  Blood group type O-.   EKG on the chart dated April 11, 2005:  Normal sinus rhythm.  Normal EKG.  It was unconfirmed.   HOSPITAL COURSE:  Admitted to [facility], tolerated the procedure  well, and was later transferred to the recovery room and then to the  orthopedic floor.  Seen in rounds on day 1.  Was doing fairly well for day 1  after surgery.  Did have some drainage on the dressing.  Started therapy.  Patient's medical physician, Dr. Amador Cunas, Dr. Felicity Coyer of [facility]  hospitalist, was consulted to assist with medical management of the patient.  Patient was placed on Glucomander initially.  BUN and creatinine actually  had improved.  It was slightly elevated preop but was better on day 1.  Had  fairly good output initially after surgery.  Continued fluids.  Started on  Coumadin for DVT prophylaxis.  Started getting up with physical therapy.  Slowly progressed with physical therapy the first day or so but then started  to improve.   By day 2, was doing well.  Dressings changed.  The incision looked good.  Hemoglobin dropped down a little bit down to 9.1 but was asymptomatic.  The  patient initially was started on glucomander but was switched back over to  insulin.  The doses of insulin and the diabetic protocol was monitored.  It  was controlled well by Dr. Felicity Coyer and was followed very closely.  Continued to progress well from an orthopedic standpoint.  Was up and  ambulating at 90 feet by day #2.  Progressed so well and was doing well from  a medical standpoint.  Postoperatively was felt to be ready to go home on  the following day.   Was seen on rounds on December 7 and was discharged  home.   DISCHARGE PLAN:  1.  Patient was discharged home on April 14, 2005.  2.  Discharge diagnoses:  Please see above.  3.  Discharge meds:  Coumadin, Percocet, Robaxin, Trinsicon, over-the-      counter stool softener, laxatives as needed.  Continue home meds.  4.  Diet:  Low cholesterol, diabetic diet.  5.  Follow up two weeks from surgery.  Call the office for an appointment at      [phone].  6.  Activity:  Weightbearing as tolerated.  Home health PT/OT and home      health nursing.  Total knee protocol.  Home health will recheck the H&H      at home due to the anemia postop, which is asymptomatic.   DISPOSITION:  Home.   CONDITION ON DISCHARGE:  Improved.      Alexzandrew L. Julien Girt, P.A.      Ollen Gross, M.D.  Electronically Signed    ALP/MEDQ  D:  05/26/2005  T:  05/26/2005  Job:  [id number]   cc:   Gordy Savers, M.D. [facility]  [address] [place]  [place] [address]   Arvilla Meres, M.D. [facility]  [address]  [place]  [place] [address]

## 2010-09-24 NOTE — H&P (Signed)
Matthew [name], Matthew [name]                ACCOUNT NO.:  [id number]   MEDICAL RECORD NO.:  [id number]          PATIENT TYPE:  OUT   LOCATION:  DFTL                         FACILITY:  [facility]   PHYSICIAN:  Ollen Gross, M.D.    DATE OF BIRTH:  Nov 18, 1950   DATE OF ADMISSION:  03/22/2004  DATE OF DISCHARGE:  03/22/2004                                HISTORY & PHYSICAL   CHIEF COMPLAINT:  Left knee pain.   HISTORY OF PRESENT ILLNESS:  The patient is a 60 year old male who has been  seen by Dr. Homero Fellers Aluisio for ongoing left knee pain.  He was seen back in a  second opinion earlier this year and he was noted to have end-stage  arthritis and was considering knee replacement.  The pain has progressively  gotten worse over this year.  He has follow-up x-rays taken this October and  found to have severe end-stage arthritis of the left knee with moderate  changes also noted on the right knee.  It was felt that he would benefit  from undergoing knee replacement.  Risks and benefits of this procedure have  been discussed with the patient and elected to proceed with the surgery.   ALLERGIES:  No known drug allergies.   CURRENT MEDICATIONS:  1.  Lotrel 5/20 with 2 capsules q.a.m.  2.  Actos 45 mg q.d. q.a.m.  3.  Hydrochlorothiazide 25 mg p.o. q.a.m.  4.  Mobic 15 mg q.d.  5.  Detrol LA 4 mg q.d. q.a.m.  6.  Furosemide 40 mg p.o. q.a.m.  7.  Potassium chloride XR tablets 20 mEq p.o. q.a.m.  8.  Tri-Chlor 145 mg p.o. q.a.m.  9.  Terazosin 10 mg p.o. q.h.s.  10. Glyburide 6 mg b.i.d.  11. Metformin 1000 mg b.i.d.  12. Lipitor 40 mg p.o. q.h.s.  13. Lantus 100 units per mg 40 units at bed time.   PAST MEDICAL HISTORY:  1.  Diabetes mellitus.  2.  Hypertension.  3.  Occasional acid reflux.  4.  Hypercholesterolemia.  5.  Urinary urgency.  6.  Peripheral edema.   PAST SURGICAL HISTORY:  1.  Tonsillectomy.  2.  Left knee arthroscopy.  3.  Hernia repair around age 66 or 79.   FAMILY HISTORY:   Father deceased at age 44 with diabetes, hypertension,  heart failure, and renal failure.  Mother deceased at age 17 with diabetes,  hypertension, and renal failure.  He has a sister, age 89, with diabetes.   SOCIAL HISTORY:  Married.  Two grown children.  No tobacco products at this  time.  Quit about 30 years ago.  Seldom intake of alcohol.  Less than one  drink per week.  He is a self-employed Nutritional therapist.   REVIEW OF SYMPTOMS:  GENERAL:  No fevers, chills, or night sweats.  NEUROLOGICAL:  No seizures, syncope, or paralysis.  RESPIRATORY:  No  shortness of breath, productive cough, or hemoptysis.  CARDIOVASCULAR:  No  chest pain, angina, or orthopnea.  GI:  No nausea, vomiting, diarrhea, or  constipation.  GU:  No dysuria, hematuria, or discharge.  MUSCULOSKELETAL:  Pertinent to the left knee found in the history of present illness.   PHYSICAL EXAMINATION:  VITAL SIGNS:  Pulse 92, respiratory 16, blood  pressure 158/72.  GENERAL:  A 60 year old white male well-developed, well-nourished,  overweight, in no acute distress.  He is alert, oriented, cooperative, very  pleasant.  HEENT:  Normocephalic and atraumatic.  Pupils are equal, round, and reactive  to light.  Oropharynx clear.  EOMs intact.  NECK:  Supple.  CHEST:  Clear.  HEART:  Regular rate and rhythm.  No murmurs.  ABDOMEN:  Soft and nontender.  Bowel sounds present.  RECTAL:  Not done, not pertinent to present illness.  BREASTS:  Not done, not pertinent to present illness.  GENITOURINARY:  Not done, not pertinent to present illness.  EXTREMITIES:  Significant to that of left knee.  The left knee shows range  of motion of 5 to 115 degrees, moderate crepitus is noted.  No instability.  Mild varus.   IMPRESSION:  1.  Osteoarthritis left knee.  2.  Diabetes mellitus.  3.  Hypertension.  4.  Acid reflux.  5.  Hypercholesterolemia.  6.  Urinary urgency.  7.  Peripheral edema.   PLAN:  The patient will be admitted to [facility].  Undergo a left  total knee arthroplasty.  Surgery will be performed by Dr. Ollen Gross.     Alex   ALP/MEDQ  D:  04/15/2004  T:  04/16/2004  Job:  [id number]   cc:   Ollen Gross, M.D.  [facility]  [address]  [address]  [place]  [place] [address]  Fax: [phone]   Gordy Savers, M.D. [facility]

## 2010-09-24 NOTE — Op Note (Signed)
NAMEBOB, DAVERSA                ACCOUNT NO.:  [id number]   MEDICAL RECORD NO.:  [id number]          PATIENT TYPE:  INP   LOCATION:  0007                         FACILITY:  [facility]   PHYSICIAN:  Ollen Gross, M.D.    DATE OF BIRTH:  01-25-1951   DATE OF PROCEDURE:  05/04/2004  DATE OF DISCHARGE:                                 OPERATIVE REPORT   PREOPERATIVE DIAGNOSIS:  Osteoarthritis, left knee.   POSTOPERATIVE DIAGNOSIS:  Osteoarthritis, left knee.   PROCEDURE:  Left total knee arthroplasty, computer-navigated.   SURGEON:  Ollen Gross, M.D.   ASSISTANT:  Avel Peace, PA-C.   ANESTHESIA:  General.   ESTIMATED BLOOD LOSS:  300.   DRAINS:  Hemovac x1.   TOURNIQUET TIME:  Fifty-nine minutes at 300 mmHg.   COMPLICATIONS:  None.   CONDITION:  Stable to the recovery room.   CLINICAL NOTE:  Leibish is a 60 year old male with severe end-stage arthritis,  both knees, left more symptomatic than the right.  He has failed  nonoperative management, including injections, and presents now for total  knee arthroplasty.   PROCEDURE IN DETAIL:  After successful administration of general anesthetic,  a tourniquet is placed high on her left thigh, and the left lower extremity  prepped and draped in the usual sterile fashion.  The extremity is wrapped  in esmarch, the knee flexed, and the tourniquet inflated to 300 mmHg.  A  standard midline incision was made with a 10 blade through the subcutaneous  tissue to the level of the extensor mechanism. A fresh blade is used to make  a medial parapatellar arthrotomy.  The soft tissue over the proximal medial  tibia is subperiosteally elevated to the joint line with a knife into the  semimembranosus bursa with a Cobb elevator.  The soft tissue over the  proximal lateral tibia is also elevated with attention being paid to avoid  the patellar tendon on the tibial tubercle.  The patella is everted, and the  knee flexed to 90 degrees.  ACL and PCL  removed.  I removed the anterior  aspect of both menisci.  The two 4 mm pins are then placed into the femur  and two additionally into the tibia.  I then placed the guides for the  computer arrays, and the arrays are placed.  We then input the data for the  generation for the computer model, so as to do the computer navigation.  Once the data and models are generated, we found that he had an 11 degree  varus deformity with about an 8-10 degree flexion contracture.  The sizing  ended up being a 3 for the femur and 4 for the tibia.  We then placed the  distal femoral cutting block on the distal femur, and placed that so it  would be neutral in the varus valgus plane and to remove 10 mm off  the  distal femur. The computer confirms the appropriate resection level and  angle, and the resection is made with an oscillating saw.  It was confirmed  to be what we had planned.  We had marked rotation off the epicondylar axis.  We placed the computer guide to show our rotation and the point for the AP  cutting block.  We got our starting holes, and then the AP cutting block is  placed for the size 3.  The anterior, posterior, and chamfer cuts are then  made.  They are confirmed to be of the appropriate configuration.   The tibia is then subluxed forward, and the menisci are removed.  The  cutting block is then placed, to removed 10 mm off the nonaffected lateral  side.  With the computer guidance, we placed it into neutral varus valgus  with 2-3 degrees of posterior slope.  For 10 mm, resection off the  nondeficient lateral side.  The block is pinned, and a tibial resection is  made with an oscillating saw.  It is confirmed to be of the planned  configuration.  A size 4 was the most appropriate tibial component.  The  proximal tibia was then prepared with the modular drill and keel punch for a  size 4.  The femoral preparation is then completed with the intercondylar  cut.   A size 3 posterior  stabilized femoral trial with a size 4 mobile-bearing  tibial trial and a 10 mm posterior stabilized rotating platform insert trial  was placed.  With the 10, full extension was achieved with a couple of  degrees of hyperextension and a tiny bit of varus valgus splay.  We went up  to 12.5, which allowed for full extension with excellent balance throughout.  The alignment had corrected to neutral varus valgus.  We thus corrected the  11 degree varus deformity.  The patella was then everted.  The thickness was  measured to be 23 mm.  Free-hand resection is taken to 12 mm.  The 41  template is placed.  Lug holes are drilled.  The trial patella is placed,  and it tracks normally.  The osteophytes are then removed off the posterior  femur with a trial in place.  All trials were removed, and the cut-bone  surfaces are prepared with pulsatile lavage.  Cement is mixed, and once  ready for implantation, a size 4 mobile-bearing tibial, size 3 posterior  stabilized femur, and 41 patella are cemented into place.  The patella is  held with a clamp.  The trial 12.5 mm insert is placed.  The knee held in  full extension, and all extruded cement is removed.  Once the cement is  fully hardened, then the permanent 12.5 mm posterior stabilized rotating  platform insert is placed into the tibial tray.  The wound is copiously  irrigated with saline solution.  The extensor mechanism closed over a  Hemovac drain with interrupted #1 PDS.  The tourniquet is released for a  total time of 59 minutes.  Flexion against gravity is 125 degrees.  The  subcu was closed with interrupted 2-0 Vicryl, the subcuticular with running  4-0 Monocryl.  The drain is then hooked to suction.  Steri-Strips and a  bulky sterile dressing are applied.  He is placed into a knee immobilizer,  awakened and transported to recovery in stable condition.     Drenda Freeze  FA/MEDQ  D:  05/04/2004  T:  05/04/2004  Job:  [id number]

## 2010-09-24 NOTE — H&P (Signed)
Matthew [name], Matthew [name]                ACCOUNT NO.:  [id number]   MEDICAL RECORD NO.:  [id number]          PATIENT TYPE:  INP   LOCATION:  NA                           FACILITY:  [facility]   PHYSICIAN:  Ollen Gross, M.D.    DATE OF BIRTH:  1950/07/09   DATE OF ADMISSION:  05/04/2004  DATE OF DISCHARGE:                                HISTORY & PHYSICAL   CHIEF COMPLAINT:  Left knee pain.   HISTORY OF PRESENT ILLNESS:  The patient is a 60 year old male well known to  Dr. Homero Fellers Aluisio.  He was originally scheduled for a total knee replacement  back in November, however, had some changes on a preoperative Cardiolite  which demonstrated multiple areas with ischemia and requiring further  workup.  He has gone through further workup and has had a cardiac  catheterization.  He was found to have single vessel coronary arterial  disease in the right coronary artery but fills distally from collaterals  from the left anterior descending and the septals.  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  He was felt to be okay to  proceed with knee replacement without any further testing.  It was  recommended that he continue his beta blocker in the perioperative period  and close follow up of hemodynamics and blood loss were recommended.   He has had follow up and has been okayed by his cardiologist and he is  subsequently admitted to the hospital.   ALLERGIES:  No known drug allergies.   CURRENT MEDICATIONS:  1.  Lotrel 10/20 p.o. q.d.  2.  Coreg 6.25 mg p.o. b.i.d.  3.  Actos 45 mg p.o. q.d. a.m.  4.  Hydrochlorothiazide 25 mg p.o. q.a.m.  5.  Mobic 15 mg q.d.  6.  Detrol LA 4 mg q.d. a.m.  7.  Furosemide 40 mg p.o. q.a.m.  8.  Potassium XR 20 mEq p.o. q.a.m.  9.  Tri-Chlor 145 mg p.o. q.a.m.  10. Terazosin 10 mg p.o. q.h.s.  11. Glyburide 6 mg p.o. b.i.d.  12. Metformin 1000 mg b.i.d.  13. Lipitor  80 mg p.o. q.h.s.  14. Lantus 40 units at bed time.   PAST MEDICAL HISTORY:  1.  Diabetes mellitus.  2.  Hypertension.  3.  Occasional acid reflux.  4.  Hypercholesterolemia.  5.  Urinary urgency.  6.  Peripheral edema.  7.  Coronary artery disease documented by cardiac catheterization.   PAST SURGICAL HISTORY:  1.  Tonsillectomy.  2.  Left knee arthroscopy.  3.  Hernia repair at age 56 or 79.   FAMILY HISTORY:  Father deceased at age 21 with diabetes, hypertension,  heart failure, and renal failure.  Mother deceased at age 70 with diabetes,  hypertension, and renal failure.  He has a sister with diabetes.   SOCIAL HISTORY:  Married with two grown children.  No tobacco products.  Quit approximately 30 years ago.  Seldom intake of alcohol.  Less than one  drink per week.  He  is a self-employed Nutritional therapist.   REVIEW OF SYMPTOMS:  GENERAL:  No fevers, chills, night sweats.  NEUROLOGICAL:  No seizures, syncope, or paralysis.  RESPIRATORY:  No  shortness of breath, productive cough, or hemoptysis.  CHEST:  No chest  pain, angina, or orthopnea.  GI:  No nausea, vomiting, diarrhea,  constipation.  GU:  No dysuria, hematuria, or discharge.  MUSCULOSKELETAL:  Left knee as in the history of present illness.   PHYSICAL EXAMINATION:  VITAL SIGNS:  Pulse 78, respiration 12, blood  pressure 162/68.  GENERAL:  A 60 year old white male, well-developed, well-nourished,  overweight.  Alert, oriented, and cooperative.  In no acute distress.  HEENT:  Normocephalic and atraumatic. Pupils are equal, round, and reactive  to light.  Oropharynx clear.  EOMs intact.  NECK:  Supple.  CHEST:  Clear.  Anterior and posterior chest wall is unremarkable for rales  or wheezing.  HEART:  Regular rate and rhythm.  No murmurs.  S1 and S2 noted.  ABDOMEN:  Soft and nontender.  Bowel sounds are present.  Round, protuberant  abdomen.  RECTAL:  Not done, not pertinent to present illness.  BREASTS:  Not done, not  pertinent to present illness.  GENITALIA:  Not done, not pertinent to present illness.  EXTREMITIES:  Left knee shows passive range of motion of 5 to 115 degrees.  There is moderate crepitus noted, varus malalignment.  Motor function is  intact.   IMPRESSION:  1.  Osteoarthritis left knee.  2.  Coronary arterial disease documented by heart catheterization.  3.  Diabetes mellitus.  4.  Hypertension.  5.  Occasional acid reflux.  6.  Hypercholesterolemia.  7.  Urinary urgency.  8.  Peripheral edema.   PLAN:  The patient will be admitted to [facility] and will undergo  a left total knee arthroplasty, computer navigated.  Surgery is to be  performed by Dr. Ollen Gross.  His medical doctor and cardiologist is Dr.  Gordy Savers and Dr. Arvilla Meres.  They will both be notified  of the room on admission and be consulted for medical and cardiac assistance  with the patient in the perioperative period.     Alex   ALP/MEDQ  D:  05/03/2004  T:  05/03/2004  Job:  [id number]   cc:   Gordy Savers, M.D. [facility]   Arvilla Meres, M.D. [facility]   Ollen Gross, M.D.  [facility]  [address]  [address]  [place]  [place] [address]  Fax: [phone]

## 2010-09-28 ENCOUNTER — Ambulatory Visit (INDEPENDENT_AMBULATORY_CARE_PROVIDER_SITE_OTHER): Payer: Medicare Other | Admitting: Internal Medicine

## 2010-09-28 ENCOUNTER — Encounter: Payer: Self-pay | Admitting: Internal Medicine

## 2010-09-28 DIAGNOSIS — I2510 Atherosclerotic heart disease of native coronary artery without angina pectoris: Secondary | ICD-10-CM

## 2010-09-28 DIAGNOSIS — I10 Essential (primary) hypertension: Secondary | ICD-10-CM

## 2010-09-28 DIAGNOSIS — E119 Type 2 diabetes mellitus without complications: Secondary | ICD-10-CM

## 2010-09-28 DIAGNOSIS — G4733 Obstructive sleep apnea (adult) (pediatric): Secondary | ICD-10-CM

## 2010-09-28 DIAGNOSIS — E785 Hyperlipidemia, unspecified: Secondary | ICD-10-CM

## 2010-09-28 LAB — GLUCOSE, POCT (MANUAL RESULT ENTRY): POC Glucose: 131

## 2010-09-28 LAB — HEMOGLOBIN A1C: Hgb A1c MFr Bld: 7.1 % — ABNORMAL HIGH (ref 4.6–6.5)

## 2010-09-28 NOTE — Patient Instructions (Signed)
Limit your sodium (Salt) intake  Avoids foods high in acid such as tomatoes citrus juices, and spicy foods.  Avoid eating within two hours of lying down or before exercising.  Do not overheat.  Try smaller more frequent meals.  If symptoms persist, elevate the head of her bed 12 inches while sleeping.    It is important that you exercise regularly, at least 20 minutes 3 to 4 times per week.  If you develop chest pain or shortness of breath seek  medical attention.   Please check your hemoglobin A1c every 3 months  You need to lose weight.  Consider a lower calorie diet and regular exercise.

## 2010-09-28 NOTE — Progress Notes (Signed)
  Subjective:    Patient ID: Matthew [name], male    DOB: 08/21/1950, 60 y.o.   MRN: [id number]  HPI  60 year old patient who has a history of morbid obesity. He is status post lap band surgery and unfortunately has regained considerable amount await. No complications of his obesity include type 2 diabetes as well as obstructive sleep apnea. His last hemoglobin A1c was 5 months ago and had risen to 7.3. He formerly had been on CPAP but this was discontinued with weight loss. His lab band was adjusted recently and he has had worsening reflux symptoms. These occur primarily at night. He has coronary artery disease which has been stable denies any exertional chest pain. He has dyslipidemia controlled on fenofibrate and atorvastatin. His blood pressure remains controlled on multiple drugs.    Review of Systems  Constitutional: Negative for fever, chills, appetite change and fatigue.  HENT: Negative for hearing loss, ear pain, congestion, sore throat, trouble swallowing, neck stiffness, dental problem, voice change and tinnitus.   Eyes: Negative for pain, discharge and visual disturbance.  Respiratory: Negative for cough, chest tightness, wheezing and stridor.   Cardiovascular: Negative for chest pain, palpitations and leg swelling.  Gastrointestinal: Negative for nausea, vomiting, abdominal pain, diarrhea, constipation, blood in stool and abdominal distention.       Considerable reflux symptoms  Genitourinary: Negative for urgency, hematuria, flank pain, discharge, difficulty urinating and genital sores.  Musculoskeletal: Negative for myalgias, back pain, joint swelling, arthralgias and gait problem.  Skin: Negative for rash.       Easy bruisability  Neurological: Negative for dizziness, syncope, speech difficulty, weakness, numbness and headaches.  Hematological: Negative for adenopathy. Does not bruise/bleed easily.  Psychiatric/Behavioral: Negative for behavioral problems and dysphoric mood. The  patient is not nervous/anxious.        Objective:   Physical Exam  Constitutional: He is oriented to person, place, and time. He appears well-developed.       Obese Weight 286 Blood pressure 132/80  HENT:  Head: Normocephalic and atraumatic.  Right Ear: External ear normal.  Left Ear: External ear normal.  Eyes: Conjunctivae and EOM are normal. Pupils are equal, round, and reactive to light.  Neck: Normal range of motion. Neck supple.  Cardiovascular: Normal rate, regular rhythm and normal heart sounds.   Pulmonary/Chest: Breath sounds normal.  Abdominal: Bowel sounds are normal.  Musculoskeletal: Normal range of motion. He exhibits edema. He exhibits no tenderness.       +1 lower extremity edema distal to the knees  Neurological: He is alert and oriented to person, place, and time.  Psychiatric: He has a normal mood and affect. His behavior is normal.          Assessment & Plan:   Diabetes mellitus. Weight loss strongly encouraged. We'll check a hemoglobin A1c Hypertension stable Coronary artery disease. Asymptomatic Dyslipidemia.Maryclare Labrador check lipid profile at the time of his annual exam next visit Morbid obesity Gastroesophageal reflux disease. Continue PPI may need lab band readjusted if his reflux symptoms remain severe

## 2010-10-22 ENCOUNTER — Other Ambulatory Visit (INDEPENDENT_AMBULATORY_CARE_PROVIDER_SITE_OTHER): Payer: Self-pay | Admitting: Surgery

## 2010-10-25 ENCOUNTER — Other Ambulatory Visit (INDEPENDENT_AMBULATORY_CARE_PROVIDER_SITE_OTHER): Payer: Self-pay | Admitting: Surgery

## 2010-10-25 ENCOUNTER — Ambulatory Visit
Admission: RE | Admit: 2010-10-25 | Discharge: 2010-10-25 | Disposition: A | Discharge status: Home / Self Care | Payer: Medicare Other | Source: Ambulatory Visit | Attending: Surgery | Admitting: Surgery

## 2010-11-06 ENCOUNTER — Other Ambulatory Visit: Payer: Self-pay | Admitting: Internal Medicine

## 2010-12-02 ENCOUNTER — Ambulatory Visit (INDEPENDENT_AMBULATORY_CARE_PROVIDER_SITE_OTHER): Payer: Medicare Other | Admitting: Family Medicine

## 2010-12-02 ENCOUNTER — Encounter: Payer: Self-pay | Admitting: Family Medicine

## 2010-12-02 VITALS — BP 200/94 | Temp 98.4°F | Wt 281.0 lb

## 2010-12-02 DIAGNOSIS — G4733 Obstructive sleep apnea (adult) (pediatric): Secondary | ICD-10-CM

## 2010-12-02 DIAGNOSIS — E119 Type 2 diabetes mellitus without complications: Secondary | ICD-10-CM

## 2010-12-02 DIAGNOSIS — I10 Essential (primary) hypertension: Secondary | ICD-10-CM

## 2010-12-02 MED ORDER — CLONIDINE HCL 0.2 MG/24HR TD PTWK: 1.0000 | MEDICATED_PATCH | TRANSDERMAL | Status: DC

## 2010-12-02 NOTE — Progress Notes (Signed)
  Subjective:    Patient ID: Matthew [name], male    DOB: March 28, 1951, 60 y.o.   MRN: [id number]  HPI Patient here with severe elevation of blood pressure. He has type2 diabetes, obesity, hyperlipidemia, obstructive sleep apnea currently off CPAP, and hypertension. His past history of CAD but no recent active problems. He had gastric bypass about 2 years ago. Blood pressure medications include amlodipine 10 mg daily, Lotensin 20 mg twice a day, carvedilol 25 mg twice a day, and furosemide 40 mg twice daily. At beach last week and had increased leg edema. Increased his Lasix to 3 times a day for several days and edema is now improving to baseline. Blood pressure has remained elevated with mostly systolics 190 to occasional over 200. Occasional headaches. Denies alcohol use. No nonsteroidal use. Denies recent chest pain. Wife has not observed any recent apnea episodes   Review of Systems  Constitutional: Negative for fever.  Eyes: Negative for visual disturbance.  Respiratory: Negative for cough, shortness of breath and wheezing.   Cardiovascular: Positive for leg swelling. Negative for chest pain and palpitations.  Gastrointestinal: Negative for abdominal pain.  Genitourinary: Negative for dysuria.  Neurological: Positive for headaches. Negative for dizziness.       Objective:   Physical Exam  Constitutional: He is oriented to person, place, and time. He appears well-developed and well-nourished.  Neck: Neck supple. No thyromegaly present.  Cardiovascular: Normal rate and regular rhythm.   Pulmonary/Chest: Effort normal and breath sounds normal. No respiratory distress. He has no wheezes. He has no rales.  Musculoskeletal: He exhibits edema.       Trace pitting edema both legs  Lymphadenopathy:    He has no cervical adenopathy.  Neurological: He is alert and oriented to person, place, and time. No cranial nerve deficit.  Psychiatric: He has a normal mood and affect.          Assessment  & Plan:  Hypertension poorly controlled. Patient is on 4 drug regimen and basically maximum doses for 3 of those medications. He is compliant with therapy. No obvious exacerbating features. History of normal renal function. Add clonidine TTS 0.2 patch one weekly and reassess blood pressure one week Obstructive sleep apnea hx and pt stopped CPAP after weight loss with gastric bypass.  Discussed if BP remains difficult to control may need to look at repeat sleep study to gauge need for CPAP.

## 2010-12-02 NOTE — Patient Instructions (Signed)
Continue to monitor blood pressure closely at home. Followup promptly for any worsening blood pressure or any new symptoms such as chest pain or shortness of breath.

## 2010-12-09 ENCOUNTER — Ambulatory Visit (INDEPENDENT_AMBULATORY_CARE_PROVIDER_SITE_OTHER): Payer: Medicare Other | Admitting: Internal Medicine

## 2010-12-09 ENCOUNTER — Telehealth: Payer: Self-pay | Admitting: Internal Medicine

## 2010-12-09 ENCOUNTER — Encounter: Payer: Self-pay | Admitting: Internal Medicine

## 2010-12-09 DIAGNOSIS — E119 Type 2 diabetes mellitus without complications: Secondary | ICD-10-CM

## 2010-12-09 DIAGNOSIS — I10 Essential (primary) hypertension: Secondary | ICD-10-CM

## 2010-12-09 MED ORDER — CLONIDINE HCL 0.1 MG PO TABS: 0.1000 mg | ORAL_TABLET | Freq: Two times a day (BID) | ORAL | Status: DC

## 2010-12-09 NOTE — Progress Notes (Signed)
  Subjective:    Patient ID: Matthew [name], male    DOB: 11/18/1950, 60 y.o.   MRN: [id number]  HPI  60 year old patient who is seen today for followup of his hypertension. Catapres patch was added to his regimen last week due to a poorly controlled hypertension. He does monitor her blood pressure readings at home with a wrist monitor and he gets quite labile readings. There is very poor correlation with his wrist monitor and our  office blood pressure readings. He has had occasional pedal edema. There's been some dyspnea on exertion Review of Systems  Constitutional: Negative for fever, chills, appetite change and fatigue.  HENT: Negative for hearing loss, ear pain, congestion, sore throat, trouble swallowing, neck stiffness, dental problem, voice change and tinnitus.   Eyes: Negative for pain, discharge and visual disturbance.  Respiratory: Positive for shortness of breath. Negative for cough, chest tightness, wheezing and stridor.   Cardiovascular: Positive for leg swelling. Negative for chest pain and palpitations.  Gastrointestinal: Negative for nausea, vomiting, abdominal pain, diarrhea, constipation, blood in stool and abdominal distention.  Genitourinary: Negative for urgency, hematuria, flank pain, discharge, difficulty urinating and genital sores.  Musculoskeletal: Negative for myalgias, back pain, joint swelling, arthralgias and gait problem.  Skin: Negative for rash.  Neurological: Negative for dizziness, syncope, speech difficulty, weakness, numbness and headaches.  Hematological: Negative for adenopathy. Does not bruise/bleed easily.  Psychiatric/Behavioral: Negative for behavioral problems and dysphoric mood. The patient is not nervous/anxious.        Objective:   Physical Exam  Constitutional: He appears well-developed and well-nourished. No distress.       Blood pressure 150/70  Pulmonary/Chest: Effort normal and breath sounds normal.       O2 saturation 97%. Pulse rate 60    Musculoskeletal: He exhibits edema.       +1 pedal edema          Assessment & Plan:

## 2010-12-09 NOTE — Telephone Encounter (Signed)
Pt would like Clonidine 0.1 mg to be sent to [phone] instead of [facility] Pt also requesting refill of Carvedilol to be sent to [facility]

## 2010-12-09 NOTE — Patient Instructions (Signed)
Limit your sodium (Salt) intake  Please check your blood pressure on a regular basis.  If it is consistently greater than 150/90, please make an office appointment.  Return in one month for follow-up  

## 2010-12-19 ENCOUNTER — Other Ambulatory Visit: Payer: Self-pay | Admitting: Internal Medicine

## 2010-12-28 ENCOUNTER — Encounter: Payer: Self-pay | Admitting: Internal Medicine

## 2010-12-28 ENCOUNTER — Ambulatory Visit (INDEPENDENT_AMBULATORY_CARE_PROVIDER_SITE_OTHER): Payer: Medicare Other | Admitting: Internal Medicine

## 2010-12-28 ENCOUNTER — Other Ambulatory Visit: Payer: Self-pay

## 2010-12-28 DIAGNOSIS — I10 Essential (primary) hypertension: Secondary | ICD-10-CM

## 2010-12-28 DIAGNOSIS — E119 Type 2 diabetes mellitus without complications: Secondary | ICD-10-CM

## 2010-12-28 DIAGNOSIS — I2510 Atherosclerotic heart disease of native coronary artery without angina pectoris: Secondary | ICD-10-CM

## 2010-12-28 DIAGNOSIS — E785 Hyperlipidemia, unspecified: Secondary | ICD-10-CM

## 2010-12-28 DIAGNOSIS — Z0000 Encounter for general adult medical examination without abnormal findings: Secondary | ICD-10-CM

## 2010-12-28 DIAGNOSIS — Z23 Encounter for immunization: Secondary | ICD-10-CM

## 2010-12-28 LAB — BASIC METABOLIC PANEL
Calcium: 8.8 mg/dL (ref 8.4–10.5)
Creatinine, Ser: 1.5 mg/dL (ref 0.4–1.5)
GFR: 50.54 mL/min — ABNORMAL LOW (ref >60.00)

## 2010-12-28 LAB — CBC WITH DIFFERENTIAL/PLATELET
Basophils Relative: 0.7 % (ref 0.0–3.0)
Eosinophils Absolute: 0.2 10*3/uL (ref 0.0–0.7)
Lymphocytes Relative: 16.4 % (ref 12.0–46.0)
MCHC: 32.7 g/dL (ref 30.0–36.0)
Neutrophils Relative %: 73 % (ref 43.0–77.0)
RBC: 4.03 Mil/uL — ABNORMAL LOW (ref 4.22–5.81)
WBC: 9.1 10*3/uL (ref 4.5–10.5)

## 2010-12-28 LAB — PSA: PSA: 0.8 ng/mL (ref 0.10–4.00)

## 2010-12-28 LAB — LIPID PANEL
HDL: 40.1 mg/dL (ref >39.00)
Total CHOL/HDL Ratio: 5
VLDL: 29.6 mg/dL (ref 0.0–40.0)

## 2010-12-28 LAB — HEPATIC FUNCTION PANEL
Alkaline Phosphatase: 41 U/L (ref 39–117)
Bilirubin, Direct: 0.1 mg/dL (ref 0.0–0.3)
Total Protein: 5.2 g/dL — ABNORMAL LOW (ref 6.0–8.3)

## 2010-12-28 LAB — HEMOGLOBIN A1C: Hgb A1c MFr Bld: 6.7 % — ABNORMAL HIGH (ref 4.6–6.5)

## 2010-12-28 LAB — TSH: TSH: 1.67 u[IU]/mL (ref 0.35–5.50)

## 2010-12-28 MED ORDER — METFORMIN HCL 1000 MG PO TABS: 1000.0000 mg | ORAL_TABLET | Freq: Two times a day (BID) | ORAL | Status: DC

## 2010-12-28 MED ORDER — POTASSIUM CHLORIDE CRYS ER 20 MEQ PO TBCR: 20.0000 meq | EXTENDED_RELEASE_TABLET | Freq: Two times a day (BID) | ORAL | Status: DC

## 2010-12-28 NOTE — Progress Notes (Signed)
Subjective:    Patient ID: Matthew [name], male    DOB: [birth year], 60 y.o.   MRN: [id number]  HPI History of Present Illness:   60year-old patient who is seen today for a preventive health examination. She has a history of morbid obesity and is now approximately 13 months status post gastric bypass. He is lost in excess of 100 pounds. He is cornea artery disease, status post CABG. History of hypertension, dyslipidemia, a long history of type 2 diabetes. He is doing quite well clinically. He has osteoarthritis and is status post bilateral knee replacement surgeries. His activities are limited somewhat by low back pain. Otherwise he does quite well.   Colonoscopy by Dr. Kinnie Scales may 2011  Preventive Screening-Counseling & Management  Alcohol-Tobacco  Smoking Status: never  Allergies (verified):  No Known Drug Allergies   Past History:  Past Medical History:  Reviewed history from 03/25/2008 and no changes required.  Diabetes mellitus, type II  Hyperlipidemia  Hypertension  DJD  OSA 11/06  Coronary artery disease  morbid obesity  diastolic dysfunction  Cholelithiasis   Past Surgical History:  Tonsillectomy  Total knee replacement bilateral; 12/06. 11/05  cardiac catheterization November 2005 showed ejection fraction of 50% with inferior hypokinesis; total proximal RCA with distal vessel filling from left to right collaterals; LAD 60% proximal and 40% mid; left circumflex 50% mid to distal lesion  status post gastric bypass surgery, July 22, 2008  colonoscopy  5/11 ([facility])  Family History:  Reviewed history from 06/01/2007 and no changes required.  both parents died in their mid 83s, both with diabetes and hypertension;  father with the peripheral vascular disease, coronary artery disease  one sister with diabetes   Social History:  Reviewed history from 06/28/2007 and no changes required.  Married  disabled due to severe arthritisSmoking Status: never   1. Risk factors,  based on past  M,S,F history. Cardiovascular risk factors include hypertension dyslipidemia and diabetes. The patient has known coronary artery disease.  2.  Physical activities: Fairly active is able to play golf, bowl  3.  Depression/mood: No history of depression or mood disorder  4.  Hearing: No deficits  5.  ADL's: Independent in all aspects of daily living  6.  Fall risk: Low 7.  Home safety: No problems identified  8.  Height weight, and visual acuity; height and weight stable. Decreased visual acuity on the left due to a retinal tear. Followed by retinal specialist at least annually 9.  Counseling: Weight loss or exercise are encouraged  10. Lab orders based on risk factors: Laboratory profile including hemoglobin A1c will be reviewed  11. Referral: Follow cardiology and ophthalmology  12. Care plan: Weight loss exercise are encouraged  13. Cognitive assessment: Alert and oriented with normal affect. No cognitive dysfunction.  Past Medical History  Diagnosis Date  . CHOLELITHIASIS 03/25/2008  . CORONARY ARTERY DISEASE 10/26/2006  . DIABETES MELLITUS, TYPE II 10/26/2006  . HYPERLIPIDEMIA 10/26/2006  . HYPERTENSION 10/26/2006  . OBESITY, MORBID 01/23/2007  . OBSTRUCTIVE SLEEP APNEA 10/26/2006   Past Surgical History  Procedure Date  . Tonsillectomy   . Total knee arthroplasty   . Cataract extraction   . Cardiac catheterization   . Retinal detachment surgery   . Laparoscopic gastric banding     reports that he quit smoking about 32 years ago. He does not have any smokeless tobacco history on file. His alcohol and drug histories not on file. family history is not on file. No Known Allergies  Review of Systems  Constitutional: Negative for fever, chills, activity change, appetite change and fatigue.  HENT: Negative for hearing loss, ear pain, congestion, rhinorrhea, sneezing, mouth sores, trouble swallowing, neck pain, neck stiffness, dental problem, voice  change, sinus pressure and tinnitus.   Eyes: Negative for photophobia, pain, redness and visual disturbance.  Respiratory: Negative for apnea, cough, choking, chest tightness, shortness of breath and wheezing.   Cardiovascular: Negative for chest pain, palpitations and leg swelling.  Gastrointestinal: Negative for nausea, vomiting, abdominal pain, diarrhea, constipation, blood in stool, abdominal distention, anal bleeding and rectal pain.  Genitourinary: Negative for dysuria, urgency, frequency, hematuria, flank pain, decreased urine volume, discharge, penile swelling, scrotal swelling, difficulty urinating, genital sores and testicular pain.  Musculoskeletal: Negative for myalgias, back pain, joint swelling, arthralgias and gait problem.  Skin: Negative for color change, rash and wound.  Neurological: Negative for dizziness, tremors, seizures, syncope, facial asymmetry, speech difficulty, weakness, light-headedness, numbness and headaches.  Hematological: Negative for adenopathy. Does not bruise/bleed easily.  Psychiatric/Behavioral: Negative for suicidal ideas, hallucinations, behavioral problems, confusion, sleep disturbance, self-injury, dysphoric mood, decreased concentration and agitation. The patient is not nervous/anxious.        Objective:   Physical Exam  Constitutional: He appears well-developed and well-nourished.  HENT:  Head: Normocephalic and atraumatic.  Right Ear: External ear normal.  Left Ear: External ear normal.  Nose: Nose normal.  Mouth/Throat: Oropharynx is clear and moist.  Eyes: Conjunctivae and EOM are normal. Pupils are equal, round, and reactive to light. No scleral icterus.  Neck: Normal range of motion. Neck supple. No JVD present. No thyromegaly present.  Cardiovascular: Regular rhythm, normal heart sounds and intact distal pulses.  Exam reveals no gallop and no friction rub.   No murmur heard. Pulmonary/Chest: Effort normal and breath sounds normal. He  exhibits no tenderness.  Abdominal: Soft. Bowel sounds are normal. He exhibits no distension and no mass. There is no tenderness.  Genitourinary: Prostate normal and penis normal.  Musculoskeletal: Normal range of motion. He exhibits no edema and no tenderness.  Lymphadenopathy:    He has no cervical adenopathy.  Neurological: He is alert. He has normal reflexes. No cranial nerve deficit. Coordination normal.  Skin: Skin is warm and dry. No rash noted.  Psychiatric: He has a normal mood and affect. His behavior is normal.          Assessment & Plan:   Preventive health care Diabetes Coronary artery disease Diastolic dysfunction Hypertension Morbid obesity  Laboratory update will be reviewed Low-salt and calorie restricted diet  encouraged Weight loss exercise encouraged

## 2010-12-28 NOTE — Progress Notes (Signed)
Addended by: Bonnye Fava on: 12/28/2010 10:47 AM   Modules accepted: Orders

## 2010-12-28 NOTE — Patient Instructions (Signed)
Limit your sodium (Salt) intake  You need to lose weight.  Consider a lower calorie diet and regular exercise.    It is important that you exercise regularly, at least 20 minutes 3 to 4 times per week.  If you develop chest pain or shortness of breath seek  medical attention.   Please check your hemoglobin A1c every 3 months   

## 2010-12-28 NOTE — Progress Notes (Signed)
  Subjective:    Patient ID: Matthew [name], male    DOB: 19-Mar-1951, 60 y.o.   MRN: [id number]  HPI  Wt Readings from Last 3 Encounters:  12/28/10 289 lb (131.09 kg)  12/09/10 290 lb (131.543 kg)  12/02/10 281 lb (127.461 kg)      Review of Systems     Objective:   Physical Exam        Assessment & Plan:

## 2010-12-29 LAB — MICROALBUMIN / CREATININE URINE RATIO: Microalb, Ur: 754 mg/dL — ABNORMAL HIGH (ref 0.0–1.9)

## 2010-12-30 NOTE — Progress Notes (Signed)
Quick Note:  Mailed to home address ______

## 2011-01-05 ENCOUNTER — Telehealth: Payer: Self-pay | Admitting: Internal Medicine

## 2011-01-05 DIAGNOSIS — I2510 Atherosclerotic heart disease of native coronary artery without angina pectoris: Secondary | ICD-10-CM

## 2011-01-05 NOTE — Telephone Encounter (Signed)
Message on triage line. States he received his lab results and that he has questions about the results and his physical that he had recently. Would appreciate a call to discuss.

## 2011-01-06 NOTE — Telephone Encounter (Signed)
Please advise before I call to discuss

## 2011-01-06 NOTE — Telephone Encounter (Signed)
Pt called again requesting labs results and to ask additional questions.

## 2011-01-06 NOTE — Telephone Encounter (Signed)
Please schedule nuclear stress test. History of coronary artery disease

## 2011-01-06 NOTE — Telephone Encounter (Signed)
I called patient to discuss his labs and his EKG findings. He has a history of coronary artery disease. Please schedule nuclear medicine stress test

## 2011-01-07 NOTE — Telephone Encounter (Signed)
Order placed. KIK

## 2011-01-19 ENCOUNTER — Other Ambulatory Visit: Payer: Self-pay

## 2011-01-19 ENCOUNTER — Ambulatory Visit (HOSPITAL_COMMUNITY): Payer: Medicare Other | Attending: Internal Medicine | Admitting: Radiology

## 2011-01-19 ENCOUNTER — Other Ambulatory Visit: Payer: Self-pay | Admitting: Internal Medicine

## 2011-01-19 DIAGNOSIS — R079 Chest pain, unspecified: Secondary | ICD-10-CM | POA: Insufficient documentation

## 2011-01-19 DIAGNOSIS — R0602 Shortness of breath: Secondary | ICD-10-CM

## 2011-01-19 DIAGNOSIS — I2510 Atherosclerotic heart disease of native coronary artery without angina pectoris: Secondary | ICD-10-CM | POA: Insufficient documentation

## 2011-01-19 DIAGNOSIS — R0789 Other chest pain: Secondary | ICD-10-CM

## 2011-01-19 MED ORDER — ADENOSINE (DIAGNOSTIC) 3 MG/ML IV SOLN
0.5600 mg/kg | Freq: Once | INTRAVENOUS | Status: AC
  Administered 2011-01-19: 60 mg via INTRAVENOUS

## 2011-01-19 MED ORDER — FUROSEMIDE 40 MG PO TABS: 40.0000 mg | ORAL_TABLET | Freq: Two times a day (BID) | ORAL | Status: DC | PRN

## 2011-01-19 MED ORDER — TECHNETIUM TC 99M TETROFOSMIN IV KIT
33.0000 | PACK | Freq: Once | INTRAVENOUS | Status: AC | PRN
  Administered 2011-01-19: 33 via INTRAVENOUS

## 2011-01-19 NOTE — Progress Notes (Signed)
[facility] SITE 3 NUCLEAR MED [address] [place] [address] [phone]  Cardiology Nuclear Med Study  Matthew [name] is a 60 y.o. male [id number] Dec 12, 1950   Nuclear Med Background Indication for Stress Test:  Evaluation for Ischemia History:   2005 Heart Catheterization EF-50%, RCA 100% with collaterals, LAD 60%, Cfx- 50% and 2005 Myocardial Perfusion Study- Abnormal Cardiac Risk Factors: Family History - CAD, History of Smoking, Hypertension, IDDM Type 2, Lipids and Obesity  Symptoms:  Chest Pressure.  (last date of chest discomfort > 1 week ago), Chest Pressure with Exertion (last date of chest discomfort > 1 week ago), DOE, Fatigue and SOB   Nuclear Pre-Procedure Caffeine/Decaff Intake:  None NPO After: 7:30am   Lungs:  Clear IV 0.9% NS with Angio Cath:  20g  IV Site: R Hand  IV Started by:  Bonnita Levan, RN  Chest Size (in):  52 Cup Size: n/a  Height: 5\' 8"  (1.727 m)  Weight:  287 lb (130.182 kg)  BMI:  Body mass index is 43.64 kg/(m^2). Tech Comments:  Patient held Coreg and Diabetic Meds, BS@11AM - 100.    Nuclear Med Study 1 or 2 day study: 2 day  Stress Test Type:  Adenosine  Reading MD: T.Laurence Folz,M.D. Order Authorizing Provider:  Dr. Lona Kettle  Resting Radionuclide: Technetium 12m Tetrofosmin  Resting Radionuclide Dose: 33.0 mCi   Stress Radionuclide:  Technetium 81m Tetrofosmin  Stress Radionuclide Dose: 33.0 mCi           Stress Protocol Rest HR: 62 Stress HR:74  Rest BP: 150/68 Stress BP: 154/59  Exercise Time (min):  4.0 METS: 1.00   Predicted Max HR: 159 bpm % Max HR: 48.43 bpm Rate Pressure Product: 52841   Dose of Adenosine (mg):  60mg  Dose of Lexiscan: n/a mg  Dose of Atropine (mg): n/a Dose of Dobutamine: n/a mcg/kg/min (at max HR)  Stress Test Technologist: Bonnita Levan, RN  Nuclear Technologist:  Domenic Polite, CNMT     Rest Procedure:  Myocardial perfusion imaging was performed at rest 45 minutes following the  intravenous administration of Technetium 87m Tetrofosmin. Rest ECG: No Acute changes from previous EKG, IVCD, occ. PVC's  Stress Procedure:  The patient received IV adenosine at 140 mcg/kg/min for 4 minutes.  There were no significant changes with infusion.  Technetium 25m Tetrofosmin was injected at the 2 minute mark and quantitative spect images were obtained after a 45 minute delay. Stress ECG: IVCD with slight increase in STT changes  QPS Raw Data Images:  Normal; no motion artifact; normal heart/lung ratio. Stress Images:  There is decreased uptake in the inferoseptal wall. Rest Images:  There is decreased uptake in the inferoseptal wall with partial reversibility. Subtraction (SDS):  These findings are consistent with ischemia. Transient Ischemic Dilatation (Normal <1.22):  1.05 Lung/Heart Ratio (Normal <0.45):  0.44  Quantitative Gated Spect Images QGS EDV:  218 ml QGS ESV:  134 ml QGS cine images:  Global hypokinesis. QGS EF: 38%  Impression Exercise Capacity:  Adenosine study with no exercise. BP Response:  Normal blood pressure response. Clinical Symptoms:  No chest pain. ECG Impression:  IVCD at rest and with stress. Comparison with Prior Nuclear Study: No images to compare  Overall Impression:  Abnormal stress nuclear study.  There is partially reversible ischemia of the inferoseptal wall at the base. Since prior study LV systolic function has declined with EF decreasing from 43% to 38% with dilated LV cavity.   Cassell Clement

## 2011-01-20 ENCOUNTER — Ambulatory Visit (HOSPITAL_COMMUNITY): Payer: Medicare Other | Attending: Internal Medicine | Admitting: Radiology

## 2011-01-20 DIAGNOSIS — R0989 Other specified symptoms and signs involving the circulatory and respiratory systems: Secondary | ICD-10-CM

## 2011-01-20 MED ORDER — TECHNETIUM TC 99M TETROFOSMIN IV KIT
33.0000 | PACK | Freq: Once | INTRAVENOUS | Status: AC | PRN
  Administered 2011-01-20: 33 via INTRAVENOUS

## 2011-01-21 ENCOUNTER — Encounter: Payer: Self-pay | Admitting: Internal Medicine

## 2011-01-21 ENCOUNTER — Other Ambulatory Visit: Payer: Self-pay | Admitting: Internal Medicine

## 2011-01-21 MED ORDER — NITROGLYCERIN 0.4 MG SL SUBL: 0.4000 mg | SUBLINGUAL_TABLET | SUBLINGUAL | Status: AC | PRN

## 2011-01-21 NOTE — Progress Notes (Signed)
Quick Note:  Spoke with pt - informed of dr. Vernon Prey instructions , new rx instructions , ordered for both done. KIK ______

## 2011-01-28 ENCOUNTER — Encounter: Payer: Self-pay | Admitting: Cardiovascular Disease

## 2011-01-28 ENCOUNTER — Ambulatory Visit (INDEPENDENT_AMBULATORY_CARE_PROVIDER_SITE_OTHER): Payer: Medicare Other | Admitting: Cardiovascular Disease

## 2011-01-28 DIAGNOSIS — I110 Hypertensive heart disease with heart failure: Secondary | ICD-10-CM

## 2011-01-28 DIAGNOSIS — I5023 Acute on chronic systolic (congestive) heart failure: Secondary | ICD-10-CM

## 2011-01-28 DIAGNOSIS — I2510 Atherosclerotic heart disease of native coronary artery without angina pectoris: Secondary | ICD-10-CM

## 2011-01-28 DIAGNOSIS — I5032 Chronic diastolic (congestive) heart failure: Secondary | ICD-10-CM | POA: Insufficient documentation

## 2011-01-28 MED ORDER — FUROSEMIDE 80 MG PO TABS: 80.0000 mg | ORAL_TABLET | Freq: Two times a day (BID) | ORAL | Status: DC

## 2011-01-28 MED ORDER — ISOSORBIDE MONONITRATE 30 MG PO TB24: 30.0000 mg | ORAL_TABLET | ORAL | Status: DC

## 2011-01-28 MED ORDER — HYDRALAZINE HCL 25 MG PO TABS: 25.0000 mg | ORAL_TABLET | Freq: Three times a day (TID) | ORAL | Status: DC

## 2011-01-28 NOTE — Assessment & Plan Note (Signed)
The patient has signs and symptoms of congestive heart failure. He appears to have reduced left ventricular systolic function based on his nuclear stress test. 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 Review of his labs shows creatinine of 1.5 mg per deciliter which is increased from his baseline of 1.1. I have recommended increasing his furosemide to 80 mg twice daily. Hydralazine 25 mg 3 times daily will be added for increased afterload reduction and better blood pressure control. He will also be started on isosorbide mononitrate 30 mg daily. The patient will have repeat labs next week and I would like to see him back in followup in 2 weeks. I advised that if he has progressive symptoms of dyspnea, orthopnea, or PND, that he should go directly to the emergency room for hospital admission as he may require inpatient treatment of congestive heart failure.  Will also check a 2-D echocardiogram to assess for LV function and the presence of valvular heart disease as his physical exam is limited because of his size.

## 2011-01-28 NOTE — Assessment & Plan Note (Signed)
The patient's blood pressure is markedly elevated and contributing to his CHF symptoms. Antihypertensive medication changes as noted.

## 2011-01-28 NOTE — Assessment & Plan Note (Signed)
The patient is not having chest pain or pressure. However, he has long-standing diabetes and may not have typical symptoms. His Myoview scan was abnormal as noted above with diminished LV function. He will likely need cardiac catheterization I think his heart failure needs to be treated before we proceed with this study.

## 2011-01-28 NOTE — Progress Notes (Signed)
HPI:  This is a 60 year old gentleman presenting for evaluation of shortness of breath and edema.  The patient complains of approximately 3 months of progressive dyspnea with exertion. He describes dyspnea with walking short distances, bending over to pick up his grandchild, and now experiences this with most activities. He also complains of fluid retention and lower extremity swelling. He has some chronic swelling but states that this is been worse lately. He notes markedly elevated blood pressure readings with systolic pressures greater than 200 mm mercury. The patient into hypertensive medications have been adjusted by Dr. Amador Cunas, as has his diuretic program.  He complains of orthopnea and PND. He denies exertional chest pain or pressure.  He denies palpitations, lightheadedness, or syncope.  The patient is morbidly obese with BMI greater than 40, he underwent lap band surgery and lost 100 pounds but has gained a good bit of this back over the last 6 months.  In review of his records, he underwent cardiac catheterization in 2005. This demonstrated total occlusion of the right coronary artery fill from left collaterals. There was nonobstructive disease noted in the LAD and left circumflex. The patient's left ventricular function was mildly reduced with an estimated LVEF of 50%.  Her recent nuclear stress test showed a dilated LV cavity, reduced LV systolic function with ejection fraction 38%, and a partially reversible defect in the inferior base.  Outpatient Encounter Prescriptions as of 01/28/2011  Medication Sig Dispense Refill  . amLODipine (NORVASC) 10 MG tablet Take 1 tablet (10 mg total) by mouth daily.  90 tablet  6  . Aspirin Buff, Al Hyd-Mg Hyd, (ASPIR-MOX) 325-75-75 MG TABS Take by mouth.        Marland Kitchen atorvastatin (LIPITOR) 80 MG tablet Take 1 tablet (80 mg total) by mouth daily.  90 tablet  6  . benazepril (LOTENSIN) 20 MG tablet TAKE 1 TABLET (20 MG TOTAL) BY MOUTH 2 (TWO) TIMES DAILY.   180 tablet  3  . Bromfenac Sodium (BROMDAY OP) Apply to eye. As directed       . carvedilol (COREG) 25 MG tablet TAKE 2 TABLETS BY MOUTH TWICE A DAY  360 tablet  1  . cloNIDine (CATAPRES) 0.1 MG tablet Take 1 tablet (0.1 mg total) by mouth 2 (two) times daily.  180 tablet  2  . fenofibrate 160 MG tablet Take 1 tablet (160 mg total) by mouth daily.  90 tablet  6  . furosemide (LASIX) 80 MG tablet Take 1 tablet (80 mg total) by mouth 2 (two) times daily.  180 tablet  3  . glucose blood test strip Use as instructed  100 each  6  . insulin aspart (NOVOLOG FLEXPEN) 100 UNIT/ML injection As directed sliding scale  10 mL  6  . insulin glargine (LANTUS SOLOSTAR) 100 UNIT/ML injection Inject 12 Units into the skin at bedtime.  10 mL  6  . Insulin Pen Needle 29G X 12.7MM MISC by Does not apply route daily.        . metFORMIN (GLUCOPHAGE) 1000 MG tablet Take 1 tablet (1,000 mg total) by mouth 2 (two) times daily with a meal.  180 tablet  3  . nitroGLYCERIN (NITROSTAT) 0.4 MG SL tablet Place 1 tablet (0.4 mg total) under the tongue every 5 (five) minutes as needed.  25 tablet  3  . potassium chloride SA (K-DUR,KLOR-CON) 20 MEQ tablet Take 1 tablet (20 mEq total) by mouth 2 (two) times daily.  180 tablet  3  . DISCONTD: furosemide (  LASIX) 40 MG tablet Take 1 tablet (40 mg total) by mouth 2 (two) times daily as needed.  180 tablet  0  . hydrALAZINE (APRESOLINE) 25 MG tablet Take 1 tablet (25 mg total) by mouth 3 (three) times daily.  270 tablet  3  . isosorbide mononitrate (IMDUR) 30 MG CR tablet Take 1 tablet (30 mg total) by mouth every morning.  90 tablet  3    Review of patient's allergies indicates no known allergies.  Past Medical History  Diagnosis Date  . CHOLELITHIASIS 03/25/2008  . CORONARY ARTERY DISEASE 10/26/2006  . DIABETES MELLITUS, TYPE II 10/26/2006  . HYPERLIPIDEMIA 10/26/2006  . HYPERTENSION 10/26/2006  . OBESITY, MORBID 01/23/2007  . OBSTRUCTIVE SLEEP APNEA 10/26/2006    Past Surgical  History  Procedure Date  . Tonsillectomy   . Total knee arthroplasty   . Cataract extraction   . Cardiac catheterization   . Retinal detachment surgery   . Laparoscopic gastric banding     History   Social History  . Marital Status: Married    Spouse Name: N/A    Number of Children: N/A  . Years of Education: N/A   Occupational History  . Not on file.   Social History Main Topics  . Smoking status: Former Smoker    Quit date: 05/09/1978  . Smokeless tobacco: Not on file  . Alcohol Use: Not on file  . Drug Use: Not on file  . Sexually Active: Not on file   Other Topics Concern  . Not on file   Social History Narrative  . No narrative on file    No family history on file.  ROS: General: no fevers/chills/night sweats Eyes: no blurry vision, diplopia, or amaurosis ENT: no sore throat or hearing loss Resp: no cough, wheezing, or hemoptysis CV: no edema or palpitations GI: no abdominal pain, nausea, vomiting, diarrhea, or constipation GU: no dysuria, frequency, or hematuria Skin: no rash Neuro: no headache, numbness, tingling, or weakness of extremities Musculoskeletal: no joint pain or swelling Heme: no bleeding, DVT, or easy bruising Endo: no polydipsia or polyuria  BP 203/92  Pulse 80  Ht 5\' 8"  (1.727 m)  Wt 291 lb (131.997 kg)  BMI 44.25 kg/m2  PHYSICAL EXAM: Pt is alert and oriented, obese male, in no distress. HEENT: normal Neck: JVP moderately elevated. Carotid upstrokes normal without bruits. No thyromegaly. Lungs: equal expansion, expiratory crackles bilaterally in the bases CV: Apex is nonpalpable, RRR without murmur or gallop Abd: soft, NT, +BS, no bruit, obese Back: no CVA tenderness Ext: 1+ bilateral pretibial edema        Femoral pulses 2+= without bruits        DP/PT pulses intact and = Skin: warm and dry without rash Neuro: CNII-XII intact             Strength intact = bilaterally  ASSESSMENT AND PLAN:

## 2011-01-28 NOTE — Patient Instructions (Signed)
Your physician has requested that you have an echocardiogram. Echocardiography is a painless test that uses sound waves to create images of your heart. It provides your doctor with information about the size and shape of your heart and how well your heart's chambers and valves are working. This procedure takes approximately one hour. There are no restrictions for this procedure.  Your physician has recommended you make the following change in your medication: INCREASE Furosemide to 80mg  twice a day, START Isosorbide MN 30mg  once a day, START Hydralazine 25mg  take one tablet by mouth three times a day  Your physician recommends that you return for lab work in 1 WEEK (BMP 428.23, 414.01)  Your physician recommends that you schedule a follow-up appointment in: 2 WEEKS

## 2011-02-02 ENCOUNTER — Other Ambulatory Visit (HOSPITAL_COMMUNITY): Payer: Medicare Other | Admitting: Radiology

## 2011-02-04 ENCOUNTER — Ambulatory Visit (HOSPITAL_COMMUNITY): Payer: Medicare Other | Attending: Cardiology | Admitting: Radiology

## 2011-02-04 ENCOUNTER — Ambulatory Visit (INDEPENDENT_AMBULATORY_CARE_PROVIDER_SITE_OTHER): Payer: Medicare Other | Admitting: *Deleted

## 2011-02-04 DIAGNOSIS — E785 Hyperlipidemia, unspecified: Secondary | ICD-10-CM | POA: Insufficient documentation

## 2011-02-04 DIAGNOSIS — I2510 Atherosclerotic heart disease of native coronary artery without angina pectoris: Secondary | ICD-10-CM

## 2011-02-04 DIAGNOSIS — E669 Obesity, unspecified: Secondary | ICD-10-CM | POA: Insufficient documentation

## 2011-02-04 DIAGNOSIS — I5023 Acute on chronic systolic (congestive) heart failure: Secondary | ICD-10-CM

## 2011-02-04 DIAGNOSIS — I059 Rheumatic mitral valve disease, unspecified: Secondary | ICD-10-CM | POA: Insufficient documentation

## 2011-02-04 DIAGNOSIS — I319 Disease of pericardium, unspecified: Secondary | ICD-10-CM | POA: Insufficient documentation

## 2011-02-04 DIAGNOSIS — R0609 Other forms of dyspnea: Secondary | ICD-10-CM | POA: Insufficient documentation

## 2011-02-04 DIAGNOSIS — I509 Heart failure, unspecified: Secondary | ICD-10-CM | POA: Insufficient documentation

## 2011-02-04 DIAGNOSIS — R609 Edema, unspecified: Secondary | ICD-10-CM

## 2011-02-04 DIAGNOSIS — I079 Rheumatic tricuspid valve disease, unspecified: Secondary | ICD-10-CM | POA: Insufficient documentation

## 2011-02-04 DIAGNOSIS — R0602 Shortness of breath: Secondary | ICD-10-CM | POA: Insufficient documentation

## 2011-02-04 DIAGNOSIS — E119 Type 2 diabetes mellitus without complications: Secondary | ICD-10-CM | POA: Insufficient documentation

## 2011-02-04 DIAGNOSIS — R0989 Other specified symptoms and signs involving the circulatory and respiratory systems: Secondary | ICD-10-CM | POA: Insufficient documentation

## 2011-02-04 LAB — BASIC METABOLIC PANEL
Chloride: 106 mEq/L (ref 96–112)
Creatinine, Ser: 2 mg/dL — ABNORMAL HIGH (ref 0.4–1.5)
GFR: 36.67 mL/min — ABNORMAL LOW (ref >60.00)
Potassium: 4 mEq/L (ref 3.5–5.1)

## 2011-02-07 ENCOUNTER — Telehealth: Payer: Self-pay | Admitting: *Deleted

## 2011-02-07 NOTE — Telephone Encounter (Signed)
Pt is complaining of post nasal sinus drainage, coughing (productive (white).  No fever x 2 weeks.  Hears rattling in chest.  Declines office visit.  Just has an echo cardiogram last week.

## 2011-02-07 NOTE — Telephone Encounter (Signed)
Suggest mucinex DM

## 2011-02-08 NOTE — Telephone Encounter (Signed)
Notified pt of Dr. Charm Rings recommendations.

## 2011-02-15 ENCOUNTER — Ambulatory Visit (INDEPENDENT_AMBULATORY_CARE_PROVIDER_SITE_OTHER): Payer: Medicare Other | Admitting: Cardiovascular Disease

## 2011-02-15 ENCOUNTER — Encounter: Payer: Self-pay | Admitting: Cardiovascular Disease

## 2011-02-15 DIAGNOSIS — I110 Hypertensive heart disease with heart failure: Secondary | ICD-10-CM

## 2011-02-15 DIAGNOSIS — I5023 Acute on chronic systolic (congestive) heart failure: Secondary | ICD-10-CM

## 2011-02-15 DIAGNOSIS — I2510 Atherosclerotic heart disease of native coronary artery without angina pectoris: Secondary | ICD-10-CM

## 2011-02-15 MED ORDER — HYDRALAZINE HCL 50 MG PO TABS: 50.0000 mg | ORAL_TABLET | Freq: Three times a day (TID) | ORAL | Status: DC

## 2011-02-15 MED ORDER — ISOSORBIDE MONONITRATE 60 MG PO TB24: 60.0000 mg | ORAL_TABLET | ORAL | Status: DC

## 2011-02-15 NOTE — Assessment & Plan Note (Signed)
The patient is improved but continues to have elevated blood pressure. I have recommended increasing his hydralazine to 50 mg 3 times daily and increasing isosorbide to 60 mg daily. I think we should reduce his daily diuretic dose in the setting of moderate renal impairment. His baseline creatinine was 1.1 mg per deciliter and this increased to 2.0 mg per deciliter on recent testing. The patient will reduce his furosemide to 80 mg once daily and we will recheck a metabolic panel in 2 weeks. I would like to see him back in clinical followup in 6 weeks.

## 2011-02-15 NOTE — Patient Instructions (Signed)
Your physician has recommended you make the following change in your medication: DECREASE Furosemide to 80mg  once a day, INCREASE Hydralazine to 50mg  three times a day, INCREASE Isosorbide MN to 60mg  once a day  Your physician recommends that you return for lab work in: 2 WEEKS (BMP 428.33, 414.01)  A chest x-ray takes a picture of the organs and structures inside the chest, including the heart, lungs, and blood vessels. This test can show several things, including, whether the heart is enlarges; whether fluid is building up in the lungs; and whether pacemaker / defibrillator leads are still in place.  [address] [place] OFFICE  Your physician recommends that you schedule a follow-up appointment in: 6 WEEKS

## 2011-02-15 NOTE — Assessment & Plan Note (Signed)
The patient is stable without angina. He does have some evidence of peri-infarct ischemia on his stress test. However, in the setting of renal impairment and heart failure I think he should continue to be treated medically as his risk of contrast nephropathy outweighs potential benefit of cardiac catheter plus or minus PCI. This was discussed with the patient and his wife and they are both in agreement with this approach.

## 2011-02-15 NOTE — Progress Notes (Signed)
HPI:  This is a 60 year old gentleman presenting for followup evaluation. The patient has coronary artery disease with known occlusion of the right coronary artery supplied by left to right collaterals and nonobstructive left coronary artery disease. He was evaluated last month for malignant hypertension and diastolic heart failure. A nuclear stress scan demonstrated worsening LV systolic function with evidence of LV dilatation and inferior infarction with peri-infarct ischemia.  This study was followed by an echocardiogram which showed preserved LV function with LV ejection fraction estimated at 55%. There was basal inferior hypokinesis but systolic function was otherwise normal.  Doppler parameters were consistent with diastolic dysfunction.  The patient is feeling a little better after taking an increased dose of diuretic. He still has shortness of breath with exertion, but reports improvement in his leg swelling. He denies chest pain or pressure. He doesn't do much physical activity because of low back pain. He denies orthopnea or PND.  He reports improvement in his blood pressure since he started on new antihypertensive medications. He still has occasional blood pressure readings in the range of 180/80. Most of his readings are in the 150s systolic.  Outpatient Encounter Prescriptions as of 02/15/2011  Medication Sig Dispense Refill  . amLODipine (NORVASC) 10 MG tablet Take 1 tablet (10 mg total) by mouth daily.  90 tablet  6  . Aspirin Buff, Al Hyd-Mg Hyd, (ASPIR-MOX) 325-75-75 MG TABS Take by mouth.        Marland Kitchen atorvastatin (LIPITOR) 80 MG tablet Take 1 tablet (80 mg total) by mouth daily.  90 tablet  6  . benazepril (LOTENSIN) 20 MG tablet TAKE 1 TABLET (20 MG TOTAL) BY MOUTH 2 (TWO) TIMES DAILY.  180 tablet  3  . Bromfenac Sodium (BROMDAY OP) Apply to eye. As directed       . carvedilol (COREG) 25 MG tablet TAKE 2 TABLETS BY MOUTH TWICE A DAY  360 tablet  1  . cloNIDine (CATAPRES) 0.1 MG tablet Take  1 tablet (0.1 mg total) by mouth 2 (two) times daily.  180 tablet  2  . fenofibrate 160 MG tablet Take 1 tablet (160 mg total) by mouth daily.  90 tablet  6  . furosemide (LASIX) 80 MG tablet Take 1 tablet (80 mg total) by mouth daily.      Marland Kitchen glucose blood test strip Use as instructed  100 each  6  . hydrALAZINE (APRESOLINE) 50 MG tablet Take 1 tablet (50 mg total) by mouth 3 (three) times daily.  270 tablet  3  . insulin aspart (NOVOLOG FLEXPEN) 100 UNIT/ML injection As directed sliding scale  10 mL  6  . insulin glargine (LANTUS SOLOSTAR) 100 UNIT/ML injection Inject 12 Units into the skin at bedtime.  10 mL  6  . Insulin Pen Needle 29G X 12.7MM MISC by Does not apply route daily.        . isosorbide mononitrate (IMDUR) 60 MG 24 hr tablet Take 1 tablet (60 mg total) by mouth every morning.  90 tablet  3  . metFORMIN (GLUCOPHAGE) 1000 MG tablet Take 1 tablet (1,000 mg total) by mouth 2 (two) times daily with a meal.  180 tablet  3  . nitroGLYCERIN (NITROSTAT) 0.4 MG SL tablet Place 1 tablet (0.4 mg total) under the tongue every 5 (five) minutes as needed.  25 tablet  3  . potassium chloride SA (K-DUR,KLOR-CON) 20 MEQ tablet Take 1 tablet (20 mEq total) by mouth 2 (two) times daily.  180 tablet  3  .  DISCONTD: furosemide (LASIX) 80 MG tablet Take 1 tablet (80 mg total) by mouth 2 (two) times daily.  180 tablet  3  . DISCONTD: hydrALAZINE (APRESOLINE) 25 MG tablet Take 1 tablet (25 mg total) by mouth 3 (three) times daily.  270 tablet  3  . DISCONTD: isosorbide mononitrate (IMDUR) 30 MG CR tablet Take 1 tablet (30 mg total) by mouth every morning.  90 tablet  3    No Known Allergies  Past Medical History  Diagnosis Date  . CHOLELITHIASIS 03/25/2008  . CORONARY ARTERY DISEASE 10/26/2006  . DIABETES MELLITUS, TYPE II 10/26/2006  . HYPERLIPIDEMIA 10/26/2006  . HYPERTENSION 10/26/2006  . OBESITY, MORBID 01/23/2007  . OBSTRUCTIVE SLEEP APNEA 10/26/2006    ROS: Negative except as per HPI  BP  150/64  Pulse 72  Resp 18  Ht 5\' 8"  (1.727 m)  Wt 285 lb 1.9 oz (129.33 kg)  BMI 43.35 kg/m2  PHYSICAL EXAM: Pt is alert and oriented, obese male in NAD HEENT: normal Neck: JVP - normal, carotids 2+= without bruits Lungs: CTA bilaterally CV: RRR without murmur or gallop Abd: soft, NT, Positive BS, no hepatomegaly Ext: 1+ bilateral leg edema, predominantly in the calves, distal pulses intact and equal Skin: warm/dry no rash  ASSESSMENT AND PLAN:

## 2011-03-01 ENCOUNTER — Ambulatory Visit (INDEPENDENT_AMBULATORY_CARE_PROVIDER_SITE_OTHER)
Admission: RE | Admit: 2011-03-01 | Discharge: 2011-03-01 | Disposition: A | Discharge status: Home / Self Care | Payer: Medicare Other | Source: Ambulatory Visit | Attending: Cardiovascular Disease | Admitting: Cardiovascular Disease

## 2011-03-01 ENCOUNTER — Other Ambulatory Visit (INDEPENDENT_AMBULATORY_CARE_PROVIDER_SITE_OTHER): Payer: Medicare Other

## 2011-03-01 ENCOUNTER — Other Ambulatory Visit: Payer: Medicare Other

## 2011-03-01 ENCOUNTER — Telehealth: Payer: Self-pay | Admitting: Cardiovascular Disease

## 2011-03-01 DIAGNOSIS — I5023 Acute on chronic systolic (congestive) heart failure: Secondary | ICD-10-CM

## 2011-03-01 DIAGNOSIS — I2510 Atherosclerotic heart disease of native coronary artery without angina pectoris: Secondary | ICD-10-CM

## 2011-03-01 LAB — BASIC METABOLIC PANEL
CO2: 23 mEq/L (ref 19–32)
GFR: 47.92 mL/min — ABNORMAL LOW (ref >60.00)
Glucose, Bld: 128 mg/dL — ABNORMAL HIGH (ref 70–99)
Potassium: 4.5 mEq/L (ref 3.5–5.1)
Sodium: 139 mEq/L (ref 135–145)

## 2011-03-01 NOTE — Telephone Encounter (Signed)
Pt to take an extra 80mg  of Lasix today and tomorrow and have Dr Excell Seltzer address this issue on Thursday per Dr Graciela Husbands (DOD).  Mr Micco agrees.

## 2011-03-01 NOTE — Telephone Encounter (Signed)
Pt calling re fluid build up, gained 20 lbs, last couple days

## 2011-03-01 NOTE — Telephone Encounter (Signed)
Matthew [name] states he was taken off his diuretic for labs about 2 weeks ago.  He was taking Lasix 80mg  bid.  He states he was decreased to 80mg  once daily but he had a cxr done today.  He states he has gained 14# in the past three weeks.  He feels swollen around his stomach, chest and ankles (pitting in ankles).  He has had sob x a few weeks which is gradually getting worse.  He also states he has cold symptoms (tickling in throat and chest, productive cough with yellow phlegm, nasal congestion at night, etx).  He states he is extremely short of breath even with just walking from one room to another in the house.  He states he had the labs done this am that he was holding the lasix for.

## 2011-03-03 ENCOUNTER — Encounter: Payer: Self-pay | Admitting: Cardiovascular Disease

## 2011-03-03 NOTE — Telephone Encounter (Signed)
Pt called. Please call back wouldn't tell me why °

## 2011-03-03 NOTE — Telephone Encounter (Signed)
Matthew [name] states he lost about 6# in the past two days.  He states he ate a hot (temp) lunch and supper yesterday and had a sharp pain in his chest.  He only gets the pain was worse when he took a breath.  No pain today.  He does not get the pain when he eats cold food.

## 2011-03-03 NOTE — Telephone Encounter (Signed)
Pt called. Please call back wouldn't tell me why

## 2011-03-03 NOTE — Telephone Encounter (Signed)
Error

## 2011-03-03 NOTE — Telephone Encounter (Signed)
Dr Excell Seltzer reviewed message, recent labs and chest x-ray.  Dr Excell Seltzer recommends that the pt go back to Furosemide 80mg  twice a day and have a BMP at his next appointment.  Pt agreed with plan.  The pt will call the office for an earlier appointment if he develops further symptoms.

## 2011-03-29 ENCOUNTER — Ambulatory Visit (INDEPENDENT_AMBULATORY_CARE_PROVIDER_SITE_OTHER): Payer: Medicare Other | Admitting: Cardiovascular Disease

## 2011-03-29 ENCOUNTER — Encounter: Payer: Self-pay | Admitting: Cardiovascular Disease

## 2011-03-29 DIAGNOSIS — I2510 Atherosclerotic heart disease of native coronary artery without angina pectoris: Secondary | ICD-10-CM

## 2011-03-29 DIAGNOSIS — I5032 Chronic diastolic (congestive) heart failure: Secondary | ICD-10-CM

## 2011-03-29 DIAGNOSIS — I509 Heart failure, unspecified: Secondary | ICD-10-CM

## 2011-03-29 NOTE — Assessment & Plan Note (Addendum)
The patient is stable on his current medical program. He is on appropriate therapy with an ACE inhibitor, beta blocker, and long-acting nitrate. His blood pressure is well-controlled and he has no congestive symptoms at present. I suspect his exertional dyspnea is multifactorial. We'll continue his current medical program and see him back in followup in 3 months. He should have a basic metabolic panel checked before his next office visit.  Note that the patient's weight is down 12 pounds from his initial office visit here in September.

## 2011-03-29 NOTE — Patient Instructions (Signed)
Your physician recommends that you schedule a follow-up appointment in: 3 MONTHS  Your physician recommends that you continue on your current medications as directed. Please refer to the Current Medication list given to you today.   

## 2011-03-29 NOTE — Progress Notes (Signed)
HPI:  [name] with hypertension, diabetes, and diastolic heart failure, presenting for followup evaluation. The patient is doing well and reports that he is feeling much better since his last office visit here. He had marked leg swelling and shortness of breath, both symptoms have improved with increase in his furosemide. He is currently taking 80 mg twice daily. He continues to have dyspnea with walking up a hill, but otherwise has no symptoms on level ground. He denies chest pain or pressure. His leg swelling has improved with the use of compression stockings. He denies orthopnea or PND. His blood pressure has been well-controlled.  Outpatient Encounter Prescriptions as of 03/29/2011  Medication Sig Dispense Refill  . amLODipine (NORVASC) 10 MG tablet Take 1 tablet (10 mg total) by mouth daily.  90 tablet  6  . Aspirin Buff, Al Hyd-Mg Hyd, (ASPIR-MOX) 325-75-75 MG TABS Take by mouth.        Marland Kitchen atorvastatin (LIPITOR) 80 MG tablet Take 1 tablet (80 mg total) by mouth daily.  90 tablet  6  . benazepril (LOTENSIN) 20 MG tablet TAKE 1 TABLET (20 MG TOTAL) BY MOUTH 2 (TWO) TIMES DAILY.  180 tablet  3  . Bromfenac Sodium (BROMDAY OP) Apply to eye. As directed       . carvedilol (COREG) 25 MG tablet TAKE 2 TABLETS BY MOUTH TWICE A DAY  360 tablet  1  . cloNIDine (CATAPRES) 0.1 MG tablet Take 1 tablet (0.1 mg total) by mouth 2 (two) times daily.  180 tablet  2  . fenofibrate 160 MG tablet Take 1 tablet (160 mg total) by mouth daily.  90 tablet  6  . furosemide (LASIX) 80 MG tablet Take 1 tablet (80 mg total) by mouth 2 (two) times daily.      Marland Kitchen glucose blood test strip Use as instructed  100 each  6  . hydrALAZINE (APRESOLINE) 50 MG tablet Take 1 tablet (50 mg total) by mouth 3 (three) times daily.  270 tablet  3  . insulin aspart (NOVOLOG FLEXPEN) 100 UNIT/ML injection As directed sliding scale  10 mL  6  . insulin glargine (LANTUS SOLOSTAR) 100 UNIT/ML injection Inject 12 Units into the  skin at bedtime.  10 mL  6  . Insulin Pen Needle 29G X 12.7MM MISC by Does not apply route daily.        . isosorbide mononitrate (IMDUR) 60 MG 24 hr tablet Take 1 tablet (60 mg total) by mouth every morning.  90 tablet  3  . metFORMIN (GLUCOPHAGE) 1000 MG tablet Take 1 tablet (1,000 mg total) by mouth 2 (two) times daily with a meal.  180 tablet  3  . nitroGLYCERIN (NITROSTAT) 0.4 MG SL tablet Place 1 tablet (0.4 mg total) under the tongue every 5 (five) minutes as needed.  25 tablet  3  . potassium chloride SA (K-DUR,KLOR-CON) 20 MEQ tablet Take 1 tablet (20 mEq total) by mouth 2 (two) times daily.  180 tablet  3    No Known Allergies  Past Medical History  Diagnosis Date  . CHOLELITHIASIS 03/25/2008  . CORONARY ARTERY DISEASE 10/26/2006  . DIABETES MELLITUS, TYPE II 10/26/2006  . HYPERLIPIDEMIA 10/26/2006  . HYPERTENSION 10/26/2006  . OBESITY, MORBID 01/23/2007  . OBSTRUCTIVE SLEEP APNEA 10/26/2006    ROS: Negative except as per HPI  BP 126/52  Pulse 65  Ht 5' 8.5" (1.74 m)  Wt 126.916 kg (279 lb 12.8 oz)  BMI 41.92 kg/m2  PHYSICAL EXAM: Pt  is alert and oriented, obese gentleman in NAD HEENT: normal Neck: JVP - normal, carotids 2+= without bruits Lungs: CTA bilaterally CV: RRR without murmur or gallop Abd: soft, NT, Positive BS, no hepatomegaly Ext: trace bilateral pretibial edema, distal pulses intact and equal Skin: warm/dry no rash  ASSESSMENT AND PLAN:

## 2011-03-29 NOTE — Assessment & Plan Note (Signed)
The patient is stable without angina. His Myoview results were reviewed again. The patient has known occlusion of his right coronary artery with left to right collaterals and his nuclear stress scan is compatible with this finding (fixed inferior defect with mild reversibility).  Recommend continued medical management in the absence of anginal symptoms.

## 2011-03-30 ENCOUNTER — Encounter: Payer: Self-pay | Admitting: Internal Medicine

## 2011-03-30 ENCOUNTER — Ambulatory Visit (INDEPENDENT_AMBULATORY_CARE_PROVIDER_SITE_OTHER): Payer: Medicare Other | Admitting: Internal Medicine

## 2011-03-30 DIAGNOSIS — I5032 Chronic diastolic (congestive) heart failure: Secondary | ICD-10-CM

## 2011-03-30 DIAGNOSIS — Z23 Encounter for immunization: Secondary | ICD-10-CM

## 2011-03-30 DIAGNOSIS — I509 Heart failure, unspecified: Secondary | ICD-10-CM

## 2011-03-30 DIAGNOSIS — Z0000 Encounter for general adult medical examination without abnormal findings: Secondary | ICD-10-CM

## 2011-03-30 DIAGNOSIS — E785 Hyperlipidemia, unspecified: Secondary | ICD-10-CM

## 2011-03-30 DIAGNOSIS — E119 Type 2 diabetes mellitus without complications: Secondary | ICD-10-CM

## 2011-03-30 DIAGNOSIS — I2510 Atherosclerotic heart disease of native coronary artery without angina pectoris: Secondary | ICD-10-CM

## 2011-03-30 LAB — HM DIABETES FOOT EXAM

## 2011-03-30 LAB — BASIC METABOLIC PANEL
CO2: 25 mEq/L (ref 19–32)
Chloride: 105 mEq/L (ref 96–112)
Creatinine, Ser: 2 mg/dL — ABNORMAL HIGH (ref 0.4–1.5)
Potassium: 4.4 mEq/L (ref 3.5–5.1)

## 2011-03-30 LAB — HEMOGLOBIN A1C: Hgb A1c MFr Bld: 6.3 % (ref 4.6–6.5)

## 2011-03-30 MED ORDER — INSULIN GLARGINE 100 UNIT/ML ~~LOC~~ SOLN: 12.0000 [IU] | Freq: Every day | SUBCUTANEOUS | Status: DC

## 2011-03-30 NOTE — Progress Notes (Signed)
  Subjective:    Patient ID: Matthew [name], male    DOB: 1950-11-28, 60 y.o.   MRN: [id number]  HPI  60 year old patient who is seen today for followup of his type 2 diabetes. This appears to be well controlled on a modest dose of Lantus at bedtime. He usually takes 12 units but if blood sugar before bedtime is less than 100 he holds his dose. He is also on a modest dose of mealtime insulin 10-15 units prior to each meal. He is doing quite well He has coronary artery disease and diastolic heart failure and has been followed closely by cardiology. He presently has had some significant fluid retention and Lasix has been increased again to 80 mg twice a day. He has had some mild worsening renal indices on this dose. Last chemistries were one month ago will repeat today. He has treated hypertension that has been stable. He has dyslipidemia morbid obesity and a history of obstructive sleep apnea. He feels he is doing quite well today with ongoing weight loss and control of his peripheral edema He is followed by ophthalmology at least twice annually    Review of Systems  Constitutional: Negative for fever, chills, appetite change and fatigue.  HENT: Negative for hearing loss, ear pain, congestion, sore throat, trouble swallowing, neck stiffness, dental problem, voice change and tinnitus.   Eyes: Negative for pain, discharge and visual disturbance.  Respiratory: Negative for cough, chest tightness, wheezing and stridor.   Cardiovascular: Positive for leg swelling. Negative for chest pain and palpitations.  Gastrointestinal: Negative for nausea, vomiting, abdominal pain, diarrhea, constipation, blood in stool and abdominal distention.  Genitourinary: Negative for urgency, hematuria, flank pain, discharge, difficulty urinating and genital sores.  Musculoskeletal: Negative for myalgias, back pain, joint swelling, arthralgias and gait problem.  Skin: Negative for rash.  Neurological: Negative for dizziness,  syncope, speech difficulty, weakness, numbness and headaches.  Hematological: Negative for adenopathy. Does not bruise/bleed easily.  Psychiatric/Behavioral: Negative for behavioral problems and dysphoric mood. The patient is not nervous/anxious.        Objective:   Physical Exam  Constitutional: He is oriented to person, place, and time. He appears well-developed.       Obese. Blood pressure well controlled  HENT:  Head: Normocephalic.  Right Ear: External ear normal.  Left Ear: External ear normal.  Eyes: Conjunctivae and EOM are normal.  Neck: Normal range of motion.  Cardiovascular: Normal rate, regular rhythm and normal heart sounds.   Pulmonary/Chest: Breath sounds normal.  Abdominal: Bowel sounds are normal.  Musculoskeletal: Normal range of motion. He exhibits edema. He exhibits no tenderness.       One plus edema  Neurological: He is alert and oriented to person, place, and time.  Psychiatric: He has a normal mood and affect. His behavior is normal.          Assessment & Plan:   Diabetes mellitus. Appears to be well controlled on present regimen. We'll check a hemoglobin A1c Hypertension stable Coronary artery disease with diastolic heart failure. We'll check chemistries today and continue present regimen. May need  dose reduction of the furosemide Dyslipidemia stable Morbid obesity. Weight loss encouraged

## 2011-03-30 NOTE — Patient Instructions (Signed)
Limit your sodium (Salt) intake  You need to lose weight.  Consider a lower calorie diet and regular exercise.   Please check your hemoglobin A1c every 3 months   

## 2011-04-05 ENCOUNTER — Telehealth: Payer: Self-pay | Admitting: Internal Medicine

## 2011-04-05 NOTE — Telephone Encounter (Signed)
Mailed

## 2011-04-05 NOTE — Telephone Encounter (Signed)
Would like a copy of lab results mailed to his home.

## 2011-05-17 ENCOUNTER — Encounter: Payer: Self-pay | Admitting: Internal Medicine

## 2011-05-17 ENCOUNTER — Telehealth: Payer: Self-pay | Admitting: *Deleted

## 2011-05-17 ENCOUNTER — Ambulatory Visit (INDEPENDENT_AMBULATORY_CARE_PROVIDER_SITE_OTHER): Payer: Medicare Other | Admitting: Internal Medicine

## 2011-05-17 DIAGNOSIS — E119 Type 2 diabetes mellitus without complications: Secondary | ICD-10-CM

## 2011-05-17 DIAGNOSIS — I5023 Acute on chronic systolic (congestive) heart failure: Secondary | ICD-10-CM

## 2011-05-17 DIAGNOSIS — I2510 Atherosclerotic heart disease of native coronary artery without angina pectoris: Secondary | ICD-10-CM

## 2011-05-17 DIAGNOSIS — E785 Hyperlipidemia, unspecified: Secondary | ICD-10-CM

## 2011-05-17 MED ORDER — HYDROCODONE-HOMATROPINE 5-1.5 MG/5ML PO SYRP: 5.0000 mL | ORAL_SOLUTION | Freq: Four times a day (QID) | ORAL | Status: AC | PRN

## 2011-05-17 MED ORDER — FUROSEMIDE 80 MG PO TABS: ORAL_TABLET | ORAL | Status: DC

## 2011-05-17 NOTE — Telephone Encounter (Signed)
Chest congestion and cough keeping him up all night.  Scheduled with Dr. Tawanna Cooler.

## 2011-05-17 NOTE — Patient Instructions (Signed)
Get plenty of rest, Drink lots of  clear liquids, and use Tylenol or ibuprofen for fever and discomfort.    

## 2011-05-17 NOTE — Progress Notes (Signed)
  Subjective:    Patient ID: Matthew [name], male    DOB: [birth year], 61 y.o.   MRN: [id number]  HPI  61 year old patient who has multiple medical problems including coronary artery disease diastolic heart failure diabetes and hypertension. He presents with a one-week history of head and chest congestion and cough. Cough has interfered with sleep. He has had a weakness and a general sense of malaise. No fever chills or purulent sputum production. No chest pain or shortness of breath.    Review of Systems  Constitutional: Positive for fatigue. Negative for fever, chills and appetite change.  HENT: Positive for congestion, rhinorrhea and postnasal drip. Negative for hearing loss, ear pain, sore throat, trouble swallowing, neck stiffness, dental problem, voice change and tinnitus.   Eyes: Negative for pain, discharge and visual disturbance.  Respiratory: Positive for cough. Negative for chest tightness, wheezing and stridor.   Cardiovascular: Negative for chest pain, palpitations and leg swelling.  Gastrointestinal: Negative for nausea, vomiting, abdominal pain, diarrhea, constipation, blood in stool and abdominal distention.  Genitourinary: Negative for urgency, hematuria, flank pain, discharge, difficulty urinating and genital sores.  Musculoskeletal: Negative for myalgias, back pain, joint swelling, arthralgias and gait problem.  Skin: Negative for rash.  Neurological: Positive for weakness. Negative for dizziness, syncope, speech difficulty, numbness and headaches.  Hematological: Negative for adenopathy. Does not bruise/bleed easily.  Psychiatric/Behavioral: Negative for behavioral problems and dysphoric mood. The patient is not nervous/anxious.        Objective:   Physical Exam  Constitutional: He is oriented to person, place, and time. He appears well-developed.  HENT:  Head: Normocephalic.  Right Ear: External ear normal.  Left Ear: External ear normal.  Eyes: Conjunctivae and EOM are  normal.  Neck: Normal range of motion.  Cardiovascular: Normal rate and normal heart sounds.   Pulmonary/Chest: Breath sounds normal.  Abdominal: Bowel sounds are normal.  Musculoskeletal: Normal range of motion. He exhibits no edema and no tenderness.  Neurological: He is alert and oriented to person, place, and time.  Psychiatric: He has a normal mood and affect. His behavior is normal.          Assessment & Plan:   Viral URI. Will treat symptomatically Hypertension well controlled Type 2 diabetes. Weight loss encouraged

## 2011-06-13 ENCOUNTER — Telehealth: Payer: Self-pay | Admitting: *Deleted

## 2011-06-13 MED ORDER — HYDROCODONE-HOMATROPINE 5-1.5 MG/5ML PO SYRP: 5.0000 mL | ORAL_SOLUTION | Freq: Four times a day (QID) | ORAL | Status: DC | PRN

## 2011-06-13 NOTE — Telephone Encounter (Signed)
Hydromet 6 ounces 1 teaspoon  every 6 hours as needed for cough and congestion 

## 2011-06-13 NOTE — Telephone Encounter (Signed)
Pt states he saw Dr. Kirtland Bouchard in January for coughing and chest cold.  Was prescribed cough medication and  has since run out.  Pt requesting another rx for cough med.  [facility] sent a refill request but has not heard back for the office.

## 2011-06-18 ENCOUNTER — Ambulatory Visit (INDEPENDENT_AMBULATORY_CARE_PROVIDER_SITE_OTHER): Payer: Medicare Other | Admitting: Internal Medicine

## 2011-06-18 ENCOUNTER — Encounter: Payer: Self-pay | Admitting: Internal Medicine

## 2011-06-18 VITALS — BP 140/64 | HR 80 | Temp 97.4°F | Resp 16 | Wt 280.0 lb

## 2011-06-18 DIAGNOSIS — I5032 Chronic diastolic (congestive) heart failure: Secondary | ICD-10-CM

## 2011-06-18 DIAGNOSIS — N189 Chronic kidney disease, unspecified: Secondary | ICD-10-CM | POA: Insufficient documentation

## 2011-06-18 DIAGNOSIS — J40 Bronchitis, not specified as acute or chronic: Secondary | ICD-10-CM

## 2011-06-18 DIAGNOSIS — R0600 Dyspnea, unspecified: Secondary | ICD-10-CM

## 2011-06-18 DIAGNOSIS — R0609 Other forms of dyspnea: Secondary | ICD-10-CM

## 2011-06-18 DIAGNOSIS — I509 Heart failure, unspecified: Secondary | ICD-10-CM

## 2011-06-18 DIAGNOSIS — E119 Type 2 diabetes mellitus without complications: Secondary | ICD-10-CM

## 2011-06-18 MED ORDER — PROMETHAZINE-CODEINE 6.25-10 MG/5ML PO SYRP: 5.0000 mL | ORAL_SOLUTION | ORAL | Status: AC | PRN

## 2011-06-18 MED ORDER — CEFUROXIME AXETIL 500 MG PO TABS: 500.0000 mg | ORAL_TABLET | Freq: Two times a day (BID) | ORAL | Status: DC

## 2011-06-18 NOTE — Progress Notes (Signed)
  Subjective:    Patient ID: Matthew [name], male    DOB: 11-23-1950, 61 y.o.   MRN: [id number]  HPI   HPI  [facility]  C/o URI sx's since [place]. C/o SOB, productive cough w/white sputum, weakness. Not better with OTC medicines. Actually, the patient is getting worse. The patient did not sleep last night due to cough.  Review of Systems  Constitutional: Positive for fever, chills and fatigue.  HENT: Positive for congestion, rhinorrhea, sneezing and postnasal drip.   Eyes: Positive for photophobia and pain. Negative for discharge and visual disturbance.  Respiratory: Positive for cough and wheezing.   Positive for chest pain.  Gastrointestinal: Negative for vomiting, abdominal pain, diarrhea and abdominal distention.  Genitourinary: Negative for dysuria and difficulty urinating.  Skin: Negative for rash.  Neurological: Positive for dizziness, weakness and light-headedness.      Review of Systems     Objective:   Physical Exam  Constitutional: He is oriented to person, place, and time. He appears well-developed. No distress.       Obese Looks tired  HENT:       Nasal mucosa is swollen w/green d/c  Eyes: Conjunctivae are normal. Pupils are equal, round, and reactive to light.  Neck: Normal range of motion. No JVD present. No thyromegaly present.  Cardiovascular: Normal rate, regular rhythm, normal heart sounds and intact distal pulses.  Exam reveals no gallop and no friction rub.   No murmur heard. Pulmonary/Chest: Effort normal. No respiratory distress. He has no wheezes. He has rales. He exhibits no tenderness.  Abdominal: Soft. Bowel sounds are normal. He exhibits no distension and no mass. There is no tenderness. There is no rebound and no guarding.  Musculoskeletal: Normal range of motion. He exhibits edema (1+ B). He exhibits no tenderness.  Lymphadenopathy:    He has no cervical adenopathy.  Neurological: He is alert and oriented to person, place, and time. He has  normal reflexes. No cranial nerve deficit. He exhibits normal muscle tone. Coordination normal.  Skin: Skin is warm and dry. No rash noted.  Psychiatric: He has a normal mood and affect. His behavior is normal. Judgment and thought content normal.   Lab Results  Component Value Date   WBC 9.1 12/28/2010   HGB 10.6* 12/28/2010   HCT 32.3* 12/28/2010   PLT 213.0 12/28/2010   GLUCOSE 139* 03/30/2011   CHOL 192 12/28/2010   TRIG 148.0 12/28/2010   HDL 40.10 12/28/2010   LDLCALC 122* 12/28/2010   ALT 14 12/28/2010   AST 24 12/28/2010   NA 138 03/30/2011   K 4.4 03/30/2011   CL 105 03/30/2011   CREATININE 2.0* 03/30/2011   BUN 34* 03/30/2011   CO2 25 03/30/2011   TSH 1.67 12/28/2010   PSA 0.80 12/28/2010   HGBA1C 6.3 03/30/2011   MICROALBUR 754.0* 12/28/2010   CXR from last fall reviewed       Assessment & Plan:

## 2011-06-18 NOTE — Patient Instructions (Addendum)
Labs, chest xray on Mon - [place] Hold Benazepril x 3 days To ER if worse   Use over-the-counter  "cold" medicines  such as"Afrin" nasal spray for nasal congestion as directed instead. Use" Delsym" or" Robitussin" cough syrup varietis for cough.  You can use plain "Tylenol" for fever, chills and achyness.

## 2011-06-18 NOTE — Assessment & Plan Note (Signed)
Labs on Mon Hold Benazepril x 2 d Dr Kirtland Bouchard. F/u next wk

## 2011-06-18 NOTE — Assessment & Plan Note (Signed)
Check labs, CXR on Mon

## 2011-06-18 NOTE — Assessment & Plan Note (Signed)
Start Ceftin Prom-Cod syr Dr Kirtland Bouchard. Next week

## 2011-06-20 ENCOUNTER — Telehealth: Payer: Self-pay | Admitting: Internal Medicine

## 2011-06-20 ENCOUNTER — Encounter: Payer: Self-pay | Admitting: Internal Medicine

## 2011-06-20 ENCOUNTER — Ambulatory Visit (INDEPENDENT_AMBULATORY_CARE_PROVIDER_SITE_OTHER)
Admission: RE | Admit: 2011-06-20 | Discharge: 2011-06-20 | Disposition: A | Discharge status: Home / Self Care | Payer: Medicare Other | Source: Ambulatory Visit | Attending: Internal Medicine | Admitting: Internal Medicine

## 2011-06-20 ENCOUNTER — Ambulatory Visit (INDEPENDENT_AMBULATORY_CARE_PROVIDER_SITE_OTHER): Payer: Medicare Other | Admitting: Internal Medicine

## 2011-06-20 DIAGNOSIS — N189 Chronic kidney disease, unspecified: Secondary | ICD-10-CM

## 2011-06-20 DIAGNOSIS — I509 Heart failure, unspecified: Secondary | ICD-10-CM

## 2011-06-20 DIAGNOSIS — R0600 Dyspnea, unspecified: Secondary | ICD-10-CM

## 2011-06-20 DIAGNOSIS — D649 Anemia, unspecified: Secondary | ICD-10-CM

## 2011-06-20 DIAGNOSIS — R0609 Other forms of dyspnea: Secondary | ICD-10-CM

## 2011-06-20 DIAGNOSIS — J40 Bronchitis, not specified as acute or chronic: Secondary | ICD-10-CM

## 2011-06-20 DIAGNOSIS — R0989 Other specified symptoms and signs involving the circulatory and respiratory systems: Secondary | ICD-10-CM

## 2011-06-20 DIAGNOSIS — I2510 Atherosclerotic heart disease of native coronary artery without angina pectoris: Secondary | ICD-10-CM

## 2011-06-20 DIAGNOSIS — I5023 Acute on chronic systolic (congestive) heart failure: Secondary | ICD-10-CM

## 2011-06-20 DIAGNOSIS — I5032 Chronic diastolic (congestive) heart failure: Secondary | ICD-10-CM

## 2011-06-20 LAB — CBC WITH DIFFERENTIAL/PLATELET
Basophils Absolute: 0.1 10*3/uL (ref 0.0–0.1)
Basophils Relative: 0.8 % (ref 0.0–3.0)
Eosinophils Absolute: 0.2 10*3/uL (ref 0.0–0.7)
HCT: 23.4 % — CL (ref 39.0–52.0)
Hemoglobin: 7.8 g/dL — CL (ref 13.0–17.0)
Lymphs Abs: 1.8 10*3/uL (ref 0.7–4.0)
MCHC: 33.3 g/dL (ref 30.0–36.0)
Neutro Abs: 4.1 10*3/uL (ref 1.4–7.7)
RDW: 18.9 % — ABNORMAL HIGH (ref 11.5–14.6)

## 2011-06-20 LAB — BASIC METABOLIC PANEL
BUN: 31 mg/dL — ABNORMAL HIGH (ref 6–23)
CO2: 28 mEq/L (ref 19–32)
Calcium: 8.6 mg/dL (ref 8.4–10.5)
Creatinine, Ser: 2.1 mg/dL — ABNORMAL HIGH (ref 0.4–1.5)

## 2011-06-20 MED ORDER — INSULIN GLARGINE 100 UNIT/ML ~~LOC~~ SOLN: 12.0000 [IU] | Freq: Every day | SUBCUTANEOUS | Status: DC

## 2011-06-20 MED ORDER — FUROSEMIDE 80 MG PO TABS: ORAL_TABLET | ORAL | Status: DC

## 2011-06-20 MED ORDER — CARVEDILOL 25 MG PO TABS: 25.0000 mg | ORAL_TABLET | Freq: Two times a day (BID) | ORAL | Status: DC

## 2011-06-20 NOTE — Telephone Encounter (Signed)
Per Clydie Braun from [employer] pt has a Hg of 7.8 and a hematocrit of 23.4.

## 2011-06-20 NOTE — Patient Instructions (Signed)
Increase furosemide to 80 mg twice daily  Take your antibiotic as prescribed until ALL of it is gone, but stop if you develop a rash, swelling, or any side effects of the medication.  Contact our office as soon as possible if  there are side effects of the medication.  Hold amlodipine  Substitute Benicar for benazepril short-term  Call if any clinical worsening  Cardiology followup as scheduled

## 2011-06-20 NOTE — Progress Notes (Signed)
  Subjective:    Patient ID: Matthew [name], male    DOB: 1951/03/01, 61 y.o.   MRN: [id number]  HPI  61 year old patient who is seen today in followup. He was evaluated 2 days ago at the [facility]. He states that he has done poorly since Christmas with the head and chest congestion and productive cough. He complains of rhinorrhea and productive cough yielding green sputum. 62 days ago he was placed on antibiotic therapy. He has a history of chronic diastolic heart failure. Due to cough,  ACE inhibition was held 2 days ago. He complains of orthopnea and nocturnal cough. He has chronic peripheral edema that fluctuates. Presently he is on Lasix 80 mg in the morning and 40 in the afternoon.   Review of Systems  Constitutional: Negative for fever, chills, appetite change and fatigue.  HENT: Positive for rhinorrhea and postnasal drip. Negative for hearing loss, ear pain, congestion, sore throat, trouble swallowing, neck stiffness, dental problem, voice change and tinnitus.   Eyes: Negative for pain, discharge and visual disturbance.  Respiratory: Positive for cough and shortness of breath. Negative for chest tightness, wheezing and stridor.   Cardiovascular: Positive for leg swelling. Negative for chest pain and palpitations.  Gastrointestinal: Negative for nausea, vomiting, abdominal pain, diarrhea, constipation, blood in stool and abdominal distention.  Genitourinary: Negative for urgency, hematuria, flank pain, discharge, difficulty urinating and genital sores.  Musculoskeletal: Negative for myalgias, back pain, joint swelling, arthralgias and gait problem.  Skin: Negative for rash.  Neurological: Negative for dizziness, syncope, speech difficulty, weakness, numbness and headaches.  Hematological: Negative for adenopathy. Does not bruise/bleed easily.  Psychiatric/Behavioral: Negative for behavioral problems and dysphoric mood. The patient is not nervous/anxious.        Objective:   Physical  Exam  Constitutional: He is oriented to person, place, and time. He appears well-developed and well-nourished. No distress.  HENT:  Head: Normocephalic.  Right Ear: External ear normal.  Left Ear: External ear normal.  Eyes: Conjunctivae and EOM are normal.  Neck: Normal range of motion.       Prominent jugular venous pressure supine    Cardiovascular: Normal rate, regular rhythm and normal heart sounds.   Pulmonary/Chest: Effort normal. No respiratory distress. He has rales.       Faint rales were present bilaterally  Abdominal: Bowel sounds are normal.  Musculoskeletal: Normal range of motion. He exhibits edema. He exhibits no tenderness.       +2 edema  Neurological: He is alert and oriented to person, place, and time.  Psychiatric: He has a normal mood and affect. His behavior is normal.          Assessment & Plan:   Acute bronchitis. We'll continue antibiotic therapy Congestive heart failure. Probably aggravated by acute bronchitis. Ace inhibitor has been held due to cough which is more likely related to bronchitis and/or congestive heart failure. We'll substitute Benicar short-term. Blood pressure is low normal today will hold the Norvasc and resume Benicar 20 mg daily we'll increase furosemide to 80 twice a day we'll check a BNP and Bmet Diabetes stable

## 2011-06-21 ENCOUNTER — Other Ambulatory Visit: Payer: Self-pay | Admitting: Internal Medicine

## 2011-06-21 ENCOUNTER — Telehealth: Payer: Self-pay | Admitting: Cardiovascular Disease

## 2011-06-21 ENCOUNTER — Telehealth: Payer: Self-pay

## 2011-06-21 MED ORDER — CARVEDILOL 25 MG PO TABS: 50.0000 mg | ORAL_TABLET | Freq: Two times a day (BID) | ORAL | Status: DC

## 2011-06-21 MED ORDER — INSULIN GLARGINE 100 UNIT/ML ~~LOC~~ SOLN: 12.0000 [IU] | Freq: Every day | SUBCUTANEOUS | Status: DC

## 2011-06-21 NOTE — Progress Notes (Signed)
Addended by: Azucena Freed on: 06/21/2011 09:25 AM   Modules accepted: Orders

## 2011-06-21 NOTE — Telephone Encounter (Signed)
Spoke with Matthew [name] from cardio - cr. Excell Seltzer would like to move appt up to thurs. 845am - pt aware. Pt in office to pick up hemo cards - instructions given . Discussed SOB, weakness , any abnormal bleeding noted - to ER

## 2011-06-21 NOTE — Progress Notes (Signed)
Quick Note:  Referral to nephrology ordered. ______

## 2011-06-21 NOTE — Telephone Encounter (Signed)
Pt appt moved to 2/14 ok per Lela.  Pt notified.

## 2011-06-21 NOTE — Telephone Encounter (Signed)
Patient just picked up his COREG from pharmacy. The SIG is 1 (25mg  tab) 2 times a day. Previously, he had been taking 2 tabs (50mg ) BID. Please call pt ASAP to clarify.

## 2011-06-21 NOTE — Telephone Encounter (Signed)
New problem:   Patient labs work  On 2/11 was  hemgo  7.8 Hct 23.4 Office aware that patient has appt has appt on Monday. Please advise .

## 2011-06-21 NOTE — Progress Notes (Signed)
Quick Note:  Spoke with dr. Earmon Phoenix office - informed of h/h from yesterday - pt has appt Monday 2/18 - just wanted to inform to see if dr. Excell Seltzer wanted to see sooner. ______

## 2011-06-21 NOTE — Telephone Encounter (Signed)
Spoke with pt- will make changes to coreg- insulin was "print" mode when ordered yesterday- will resend.

## 2011-06-21 NOTE — Progress Notes (Signed)
Quick Note:  Pt aware and will pick up Hemoccult slides. ______

## 2011-06-21 NOTE — Telephone Encounter (Signed)
Pt needs insulin pens 90 day supply  call into [facility] [address]

## 2011-06-23 ENCOUNTER — Encounter: Payer: Self-pay | Admitting: Cardiovascular Disease

## 2011-06-23 ENCOUNTER — Ambulatory Visit (INDEPENDENT_AMBULATORY_CARE_PROVIDER_SITE_OTHER): Payer: Medicare Other | Admitting: Cardiovascular Disease

## 2011-06-23 DIAGNOSIS — I509 Heart failure, unspecified: Secondary | ICD-10-CM

## 2011-06-23 DIAGNOSIS — I5032 Chronic diastolic (congestive) heart failure: Secondary | ICD-10-CM

## 2011-06-23 DIAGNOSIS — I2510 Atherosclerotic heart disease of native coronary artery without angina pectoris: Secondary | ICD-10-CM

## 2011-06-23 DIAGNOSIS — I5023 Acute on chronic systolic (congestive) heart failure: Secondary | ICD-10-CM

## 2011-06-23 DIAGNOSIS — D649 Anemia, unspecified: Secondary | ICD-10-CM

## 2011-06-23 LAB — CBC WITH DIFFERENTIAL/PLATELET
Basophils Relative: 0.8 % (ref 0.0–3.0)
Eosinophils Absolute: 0.1 10*3/uL (ref 0.0–0.7)
Eosinophils Relative: 1.8 % (ref 0.0–5.0)
Hemoglobin: 7.5 g/dL — CL (ref 13.0–17.0)
Lymphocytes Relative: 12.9 % (ref 12.0–46.0)
MCHC: 32.2 g/dL (ref 30.0–36.0)
MCV: 73.5 fl — ABNORMAL LOW (ref 78.0–100.0)
Monocytes Absolute: 0.8 10*3/uL (ref 0.1–1.0)
Neutro Abs: 5.9 10*3/uL (ref 1.4–7.7)
RBC: 3.19 Mil/uL — ABNORMAL LOW (ref 4.22–5.81)
WBC: 8 10*3/uL (ref 4.5–10.5)

## 2011-06-23 NOTE — Progress Notes (Signed)
HPI:  [name] returns for followup. He is a 61 year old gentleman with diastolic heart failure, chronic kidney disease, obesity, hypertension, diabetes, and dyslipidemia.  The patient has developed symptoms of cough and shortness of breath. His symptoms are worse when lying down. He was recently seen by Dr. Amador Cunas and medications were adjusted. This included an increase in his furosemide dose to 80 mg twice daily. He feels better since he is med changes were made. Lab work was done and this demonstrated new anemia with a hemoglobin of 7.8. He is doing stool cards and will turn them in tomorrow.  He does note dark loose stools over the last few days.  The patient denies chest pain or pressure. His main symptom is breathlessness. He denies weakness, lightheadedness, or syncope. As above, his cough and shortness of breath haven't proved over the past few days and he has been able to sleep in his bed for the first time in several weeks.  Outpatient Encounter Prescriptions as of 06/23/2011  Medication Sig Dispense Refill  . atorvastatin (LIPITOR) 80 MG tablet Take 1 tablet (80 mg total) by mouth daily.  90 tablet  6  . carvedilol (COREG) 25 MG tablet Take 2 tablets (50 mg total) by mouth 2 (two) times daily with a meal.  360 tablet  3  . cefUROXime (CEFTIN) 500 MG tablet Take 1 tablet (500 mg total) by mouth 2 (two) times daily.  20 tablet  1  . cloNIDine (CATAPRES) 0.1 MG tablet Take 1 tablet (0.1 mg total) by mouth 2 (two) times daily.  180 tablet  2  . fenofibrate 160 MG tablet Take 1 tablet (160 mg total) by mouth daily.  90 tablet  6  . furosemide (LASIX) 80 MG tablet Take 80 mg by mouth 2 (two) times daily. 1 tablet in the morning and one half tablet in the early afternoon      . glucose blood test strip Use as instructed  100 each  6  . hydrALAZINE (APRESOLINE) 50 MG tablet Take one and one-half tablet by mouth three times a day      . insulin aspart (NOVOLOG FLEXPEN) 100 UNIT/ML injection As  directed sliding scale  10 mL  6  . insulin glargine (LANTUS SOLOSTAR) 100 UNIT/ML injection Inject 12 Units into the skin at bedtime.  10 mL  6  . Insulin Pen Needle 29G X 12.7MM MISC by Does not apply route daily.        . isosorbide mononitrate (IMDUR) 60 MG 24 hr tablet Take 1 tablet (60 mg total) by mouth every morning.  90 tablet  3  . metFORMIN (GLUCOPHAGE) 1000 MG tablet Take 1 tablet (1,000 mg total) by mouth 2 (two) times daily with a meal.  180 tablet  3  . nitroGLYCERIN (NITROSTAT) 0.4 MG SL tablet Place 1 tablet (0.4 mg total) under the tongue every 5 (five) minutes as needed.  25 tablet  3  . olmesartan (BENICAR) 20 MG tablet ON HOLD starting 06/23/11--20mg  take one by mouth two times a day      . potassium chloride SA (K-DUR,KLOR-CON) 20 MEQ tablet Take 1 tablet (20 mEq total) by mouth 2 (two) times daily.  180 tablet  3  . promethazine-codeine (PHENERGAN WITH CODEINE) 6.25-10 MG/5ML syrup Take 5 mLs by mouth every 4 (four) hours as needed for cough.  240 mL  0  . DISCONTD: Aspirin Buff, Al Hyd-Mg Hyd, (ASPIR-MOX) 325-75-75 MG TABS Take by mouth.        Marland Kitchen  DISCONTD: furosemide (LASIX) 80 MG tablet 1 tablet in the morning and one half tablet in the early afternoon  90 tablet  4  . DISCONTD: hydrALAZINE (APRESOLINE) 50 MG tablet Take 1 tablet (50 mg total) by mouth 3 (three) times daily.  270 tablet  3  . DISCONTD: olmesartan (BENICAR) 20 MG tablet Take 20 mg by mouth 2 (two) times daily.      Marland Kitchen aspirin 325 MG tablet ON HOLD 06/23/11--one by mouth daily      . DISCONTD: benazepril (LOTENSIN) 20 MG tablet TAKE 1 TABLET (20 MG TOTAL) BY MOUTH 2 (TWO) TIMES DAILY.  180 tablet  3  . DISCONTD: Bromfenac Sodium (BROMDAY OP) Apply to eye. As directed       . DISCONTD: HYDROcodone-homatropine (HYCODAN) 5-1.5 MG/5ML syrup Take 5 mLs by mouth every 6 (six) hours as needed for cough.  120 mL  0    No Known Allergies  Past Medical History  Diagnosis Date  . CHOLELITHIASIS 03/25/2008  . CORONARY  ARTERY DISEASE 10/26/2006  . DIABETES MELLITUS, TYPE II 10/26/2006  . HYPERLIPIDEMIA 10/26/2006  . HYPERTENSION 10/26/2006  . OBESITY, MORBID 01/23/2007  . OBSTRUCTIVE SLEEP APNEA 10/26/2006    ROS: Negative except as per HPI  BP 150/64  Pulse 74  Ht 5\' 9"  (1.753 m)  Wt 125.102 kg (275 lb 12.8 oz)  BMI 40.73 kg/m2  PHYSICAL EXAM: Pt is alert and oriented, obese male in NAD HEENT: normal Neck: JVP - normal, carotids 2+= without bruits Lungs: inspiratory rales in the left base, otherwise clear CV: RRR without murmur or gallop Abd: soft, NT, Positive BS, obese Ext: 2+ pretibial edema right leg, 1+ pretibial edema left leg. Chronic stasis changes noted. Skin: warm/dry no rash  EKG:  Normal sinus rhythm with nonspecific IVCD and ST and T wave abnormality consider lateral ischemia.  There is no change from previous tracing December 28, 2010.  ASSESSMENT AND PLAN:

## 2011-06-23 NOTE — Assessment & Plan Note (Signed)
I spoke with Dr. Amador Cunas. The patient will turn in his stool cards tomorrow. I will repeat a CBC today to make sure that his hemoglobin is stable. His blood pressure remains normal he does not have postural symptoms, so there are no signs of hemodynamic compromise.

## 2011-06-23 NOTE — Assessment & Plan Note (Signed)
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 He had a Myoview scan last year demonstrating inferior infarct with mild. Infarct ischemia. I felt this was consistent with his known coronary anatomy and did not pursue repeat cardiac catheter at that time. We'll continue to manage him medically. I did ask him to hold his aspirin as it appears he may have a GI bleed.

## 2011-06-23 NOTE — Patient Instructions (Signed)
Your physician recommends that you have lab work today: CBC  Your physician has recommended you make the following change in your medication: HOLD Benicar and Aspirin, INCREASE Hydralazine to 50mg  take one and one-half tablet by mouth three times a day  Your physician recommends that you schedule a follow-up appointment in: 2 WEEKS with a BMP and BNP at that appointment

## 2011-06-23 NOTE — Assessment & Plan Note (Signed)
The patient has acute on chronic diastolic heart failure, now with New York Heart Association class III symptoms. He has improved with adjustments in his medical regimen. I have reviewed all his lab work and chest x-ray findings, both of which are suggestive of congestive heart failure. His creatinine is increased have advised him to temporarily put Benicar on hold and we will increase his hydralazine to 75 mg 3 times daily. He will continue his current carvedilol dose. He'll also continue his current furosemide dose. He has an upcoming appointment with nephrology. I will see him back closely and followup with the metabolic panel and office visit in one to 2 weeks.

## 2011-06-24 ENCOUNTER — Telehealth: Payer: Self-pay | Admitting: Internal Medicine

## 2011-06-24 ENCOUNTER — Telehealth: Payer: Self-pay | Admitting: Cardiovascular Disease

## 2011-06-24 ENCOUNTER — Other Ambulatory Visit (INDEPENDENT_AMBULATORY_CARE_PROVIDER_SITE_OTHER): Payer: Medicare Other

## 2011-06-24 DIAGNOSIS — K921 Melena: Secondary | ICD-10-CM

## 2011-06-24 DIAGNOSIS — IMO0001 Reserved for inherently not codable concepts without codable children: Secondary | ICD-10-CM

## 2011-06-24 LAB — HEMOCCULT GUIAC POC 1CARD (OFFICE)
Card #2 Fecal Occult Blod, POC: NEGATIVE
Card #3 Date: NEGATIVE
Card #3 Fecal Occult Blood, POC: NEGATIVE

## 2011-06-24 NOTE — Telephone Encounter (Signed)
New Problem:     Patient called in today wanting to know if the results of his lab work he did yesterday has come in yet.  Please call back.

## 2011-06-24 NOTE — Telephone Encounter (Signed)
Pt stated iron is low. Please advise

## 2011-06-24 NOTE — Telephone Encounter (Signed)
I spoke with the pt and gave him the results of CBC.  The pt will follow-up with PCP for further evaluation of anemia.

## 2011-06-24 NOTE — Telephone Encounter (Signed)
Continue iron therapy. Notify patient that there is no evidence of GI bleeding. Anemia may be secondary to his renal disease

## 2011-06-24 NOTE — Telephone Encounter (Signed)
Please advise - lab done by dr. Excell Seltzer

## 2011-06-24 NOTE — Telephone Encounter (Signed)
Spoke with pt- informed of dr. kwiatkowski's instructions 

## 2011-06-27 ENCOUNTER — Ambulatory Visit: Payer: Medicare Other | Admitting: Cardiovascular Disease

## 2011-06-29 ENCOUNTER — Ambulatory Visit: Payer: Medicare Other | Admitting: Internal Medicine

## 2011-07-01 ENCOUNTER — Other Ambulatory Visit: Payer: Self-pay | Admitting: Internal Medicine

## 2011-07-05 ENCOUNTER — Ambulatory Visit (INDEPENDENT_AMBULATORY_CARE_PROVIDER_SITE_OTHER): Payer: Medicare Other | Admitting: Cardiovascular Disease

## 2011-07-05 ENCOUNTER — Encounter: Payer: Self-pay | Admitting: Cardiovascular Disease

## 2011-07-05 DIAGNOSIS — I5032 Chronic diastolic (congestive) heart failure: Secondary | ICD-10-CM

## 2011-07-05 DIAGNOSIS — I2510 Atherosclerotic heart disease of native coronary artery without angina pectoris: Secondary | ICD-10-CM

## 2011-07-05 DIAGNOSIS — I110 Hypertensive heart disease with heart failure: Secondary | ICD-10-CM

## 2011-07-05 DIAGNOSIS — I509 Heart failure, unspecified: Secondary | ICD-10-CM

## 2011-07-05 NOTE — Assessment & Plan Note (Signed)
Stable without angina.  Continue medical therapy

## 2011-07-05 NOTE — Progress Notes (Signed)
HPI:  61 year old gentleman presenting for followup evaluation. The patient has diastolic heart failure, stage III chronic kidney disease, obesity, hypertension, diabetes, and anemia. He was just seen 2 weeks ago with cough and shortness of breath. I was concerned about progressive heart failure and brought him back today for close followup. He tells me his breathing has improved markedly since he was seen last. He still has mild dyspnea with exertion, but no resting symptoms. He reports no edema, orthopnea, or PND. He's had no chest pain or pressure.  The patient was also noted to have progressive anemia. His hemoglobin in August 2012 was 10.6 and on recent evaluation it has been in the 7.5-7.8 range. Stool cards were done by Dr. Lesia Hausen and they were negative.  Outpatient Encounter Prescriptions as of 07/05/2011  Medication Sig Dispense Refill  . amLODipine (NORVASC) 10 MG tablet TAKE 1 TABLET BY MOUTH EVERY DAY  90 tablet  1  . aspirin 325 MG tablet ON HOLD 06/23/11--one by mouth daily      . atorvastatin (LIPITOR) 80 MG tablet Take 1 tablet (80 mg total) by mouth daily.  90 tablet  6  . carvedilol (COREG) 25 MG tablet Take 25 mg by mouth 4 (four) times daily.      . cloNIDine (CATAPRES) 0.1 MG tablet Take 1 tablet (0.1 mg total) by mouth 2 (two) times daily.  180 tablet  2  . fenofibrate 160 MG tablet TAKE 1 TABLET BY MOUTH EVERY DAY  90 tablet  1  . furosemide (LASIX) 80 MG tablet Take 80 mg by mouth 2 (two) times daily. 1 tablet in the morning and one  tablet in the early afternoon      . glucose blood test strip Use as instructed  100 each  6  . hydrALAZINE (APRESOLINE) 50 MG tablet Take one and one-half tablet by mouth three times a day      . insulin aspart (NOVOLOG FLEXPEN) 100 UNIT/ML injection As directed sliding scale  10 mL  6  . insulin glargine (LANTUS SOLOSTAR) 100 UNIT/ML injection Inject 12 Units into the skin at bedtime.  10 mL  6  . Insulin Pen Needle 29G X 12.7MM MISC by  Does not apply route daily.        . isosorbide mononitrate (IMDUR) 60 MG 24 hr tablet Take 1 tablet (60 mg total) by mouth every morning.  90 tablet  3  . metFORMIN (GLUCOPHAGE) 1000 MG tablet Take 1 tablet (1,000 mg total) by mouth 2 (two) times daily with a meal.  180 tablet  3  . nitroGLYCERIN (NITROSTAT) 0.4 MG SL tablet Place 1 tablet (0.4 mg total) under the tongue every 5 (five) minutes as needed.  25 tablet  3  . olmesartan (BENICAR) 20 MG tablet Take 20 mg by mouth 2 (two) times daily.       . potassium chloride SA (K-DUR,KLOR-CON) 20 MEQ tablet Take 1 tablet (20 mEq total) by mouth 2 (two) times daily.  180 tablet  3  . DISCONTD: carvedilol (COREG) 25 MG tablet Take 2 tablets (50 mg total) by mouth 2 (two) times daily with a meal.  360 tablet  3  . benazepril (LOTENSIN) 20 MG tablet       . DISCONTD: benazepril (LOTENSIN) 20 MG tablet TAKE 1 TABLET BY MOUTH TWICE A DAY  180 tablet  1  . DISCONTD: cefUROXime (CEFTIN) 500 MG tablet Take 1 tablet (500 mg total) by mouth 2 (two) times daily.  20 tablet  1    No Known Allergies  Past Medical History  Diagnosis Date  . CHOLELITHIASIS 03/25/2008  . CORONARY ARTERY DISEASE 10/26/2006  . DIABETES MELLITUS, TYPE II 10/26/2006  . HYPERLIPIDEMIA 10/26/2006  . HYPERTENSION 10/26/2006  . OBESITY, MORBID 01/23/2007  . OBSTRUCTIVE SLEEP APNEA 10/26/2006    ROS: Negative except as per HPI  BP 132/64  Pulse 66  Ht 5' 8.5" (1.74 m)  Wt 122.018 kg (269 lb)  BMI 40.31 kg/m2  PHYSICAL EXAM: Pt is alert and oriented, obese male in NAD HEENT: normal Neck: JVP - normal, carotids 2+= without bruits Lungs: CTA bilaterally CV: RRR without murmur or gallop Abd: soft, NT, Positive BS, no hepatomegaly Ext: Trace edema bilaterally, distal pulses intact and equal Skin: warm/dry no rash  ASSESSMENT AND PLAN:

## 2011-07-05 NOTE — Assessment & Plan Note (Signed)
The patient appears stable on his current medical regimen. There is some uncertainty about his medication list. He has discontinued benazepril. Benicar is still on his list but it was held at his last office visit because of concerns about progressive renal insufficiency. In review of his renal function, there has not been a dramatic change over the past year with creatinine ranging between 1.5 and 2.1 mg/dL. The patient sees Dr. Briant Cedar in consultation tomorrow he'll defer to him whether he feels the patient should be on a long-term angiotensin receptor blocker but I suspect he will want the patient to resume this. Otherwise we'll continue his current medications and I would like to see him back in 3 months for followup.

## 2011-07-05 NOTE — Assessment & Plan Note (Signed)
Blood pressure is now well controlled on multidrug therapy. He is currently on a combination of amlodipine, carvedilol, clonidine, Imdur, and hydralazine. Hydralazine was increased to 75 mg 3 times daily at the time of his last visit. As above, Benicar is currently on hold.

## 2011-07-05 NOTE — Patient Instructions (Signed)
Your physician recommends that you schedule a follow-up appointment in: 3 MONTHS  Please speak with Dr Briant Cedar about whether or not you should continue taking Benicar (Olmesartan).

## 2011-07-08 ENCOUNTER — Other Ambulatory Visit: Payer: Self-pay | Admitting: Nephrology

## 2011-07-11 ENCOUNTER — Telehealth: Payer: Self-pay | Admitting: Cardiovascular Disease

## 2011-07-11 NOTE — Telephone Encounter (Signed)
New msg Pt wants to talk to you about benicar and taking aspirin again. His bp 181/84 pulse 70 and  193/86. Please call him back before 5 pm

## 2011-07-11 NOTE — Telephone Encounter (Signed)
Wants call back, made aware that Dr Cooper/ RN not here and will be back tomorrow, pt will be home in afternoon so please call then. Pt agreed to plan.

## 2011-07-12 ENCOUNTER — Ambulatory Visit
Admission: RE | Admit: 2011-07-12 | Discharge: 2011-07-12 | Disposition: A | Discharge status: Home / Self Care | Payer: Medicare Other | Source: Ambulatory Visit | Attending: Nephrology | Admitting: Nephrology

## 2011-07-12 ENCOUNTER — Other Ambulatory Visit: Payer: Self-pay

## 2011-07-12 DIAGNOSIS — I5023 Acute on chronic systolic (congestive) heart failure: Secondary | ICD-10-CM

## 2011-07-12 DIAGNOSIS — I2510 Atherosclerotic heart disease of native coronary artery without angina pectoris: Secondary | ICD-10-CM

## 2011-07-12 NOTE — Telephone Encounter (Signed)
Spoke with patient and advise that he resume aspirin 81 mg daily. He is recording home blood pressures and is working with Dr. Briant Cedar to make adjustments. I did not adjust his antihypertensive medications today.

## 2011-07-15 ENCOUNTER — Other Ambulatory Visit (HOSPITAL_COMMUNITY): Payer: Self-pay | Admitting: *Deleted

## 2011-07-18 ENCOUNTER — Encounter: Payer: Self-pay | Admitting: Internal Medicine

## 2011-07-18 ENCOUNTER — Encounter (HOSPITAL_COMMUNITY)
Admission: RE | Admit: 2011-07-18 | Discharge: 2011-07-18 | Disposition: A | Discharge status: Home / Self Care | Payer: Medicare Other | Source: Ambulatory Visit | Attending: Nephrology | Admitting: Nephrology

## 2011-07-18 ENCOUNTER — Ambulatory Visit (INDEPENDENT_AMBULATORY_CARE_PROVIDER_SITE_OTHER): Payer: Medicare Other | Admitting: Internal Medicine

## 2011-07-18 DIAGNOSIS — E119 Type 2 diabetes mellitus without complications: Secondary | ICD-10-CM

## 2011-07-18 DIAGNOSIS — D509 Iron deficiency anemia, unspecified: Secondary | ICD-10-CM | POA: Insufficient documentation

## 2011-07-18 DIAGNOSIS — N189 Chronic kidney disease, unspecified: Secondary | ICD-10-CM

## 2011-07-18 DIAGNOSIS — I110 Hypertensive heart disease with heart failure: Secondary | ICD-10-CM

## 2011-07-18 LAB — BASIC METABOLIC PANEL
BUN: 31 mg/dL — ABNORMAL HIGH (ref 6–23)
CO2: 25 mEq/L (ref 19–32)
GFR calc non Af Amer: 34 mL/min — ABNORMAL LOW (ref >90)
Glucose, Bld: 127 mg/dL — ABNORMAL HIGH (ref 70–99)
Potassium: 3.8 mEq/L (ref 3.5–5.1)

## 2011-07-18 MED ORDER — SODIUM CHLORIDE 0.9 % IV SOLN
INTRAVENOUS | Status: DC
  Administered 2011-07-18: 13:00:00 via INTRAVENOUS

## 2011-07-18 MED ORDER — FERUMOXYTOL INJECTION 510 MG/17 ML
510.0000 mg | INTRAVENOUS | Status: DC
  Administered 2011-07-18: 510 mg via INTRAVENOUS
  Filled 2011-07-18: qty 17

## 2011-07-18 NOTE — Progress Notes (Signed)
  Subjective:    Patient ID: Matthew [name], male    DOB: 13-Dec-1950, 61 y.o.   MRN: [id number]  HPI  61 year old patient who is in today for followup. He was seen one month ago with decompensated congestive heart failure and his diuretic dose increased to 80 mg of furosemide twice daily. There has been a nice diuresis weight loss and resolution of his peripheral edema. He has been seen by cardiology and also by nephrology for worsening renal insufficiency. Creatinine is now greater than 2. He feels much improved today he is maintaining very nice glycemic control but remains on metformin therapy. This will be discontinued today due to the renal insufficiency. He also states his systolic blood pressure readings are consistently elevated. When seen here one month ago blood pressure was slightly low and amlodipine therapy was placed on hold. He was on 10 mg daily  Wt Readings from Last 3 Encounters:  07/18/11 264 lb (119.75 kg)  07/05/11 269 lb (122.018 kg)  06/23/11 275 lb 12.8 oz (125.102 kg)     Review of Systems  Constitutional: Negative for fever, chills, appetite change and fatigue.  HENT: Negative for hearing loss, ear pain, congestion, sore throat, trouble swallowing, neck stiffness, dental problem, voice change and tinnitus.   Eyes: Negative for pain, discharge and visual disturbance.  Respiratory: Negative for cough, chest tightness, wheezing and stridor.   Cardiovascular: Negative for chest pain, palpitations and leg swelling (resolved).  Gastrointestinal: Negative for nausea, vomiting, abdominal pain, diarrhea, constipation, blood in stool and abdominal distention.  Genitourinary: Negative for urgency, hematuria, flank pain, discharge, difficulty urinating and genital sores.  Musculoskeletal: Negative for myalgias, back pain, joint swelling, arthralgias and gait problem.  Skin: Negative for rash.  Neurological: Negative for dizziness, syncope, speech difficulty, weakness, numbness and  headaches.  Hematological: Negative for adenopathy. Does not bruise/bleed easily.  Psychiatric/Behavioral: Negative for behavioral problems and dysphoric mood. The patient is not nervous/anxious.        Objective:   Physical Exam  Constitutional: He is oriented to person, place, and time. He appears well-developed.       Blood pressure 130 to 150 systolic over 60-64 diastolic weight 264  HENT:  Head: Normocephalic.  Right Ear: External ear normal.  Left Ear: External ear normal.  Eyes: Conjunctivae and EOM are normal.  Neck: Normal range of motion.  Cardiovascular: Normal rate and normal heart sounds.   Pulmonary/Chest: Effort normal and breath sounds normal. He has no rales.  Abdominal: Bowel sounds are normal.  Musculoskeletal: Normal range of motion. He exhibits no edema and no tenderness.  Neurological: He is alert and oriented to person, place, and time.  Psychiatric: He has a normal mood and affect. His behavior is normal.          Assessment & Plan:     Congestive heart failure improved. We'll continue present regimen- followup cardiology. May consider slight dose reduction of furosemide. Is scheduled for followup Bmet later today  Hypertension. Fairly well controlled here but consistently high systolic readings as an outpatient. Have asked to bring the patient's blood pressure cuff for his next visit. He is also scheduled to see nephrology soon;  will likely need resuming a lower  dose amlodipine for better systolic control  Diabetes mellitus. Good control. We'll discontinue metformin in view of chronic kidney disease

## 2011-07-18 NOTE — Patient Instructions (Signed)
Discontinue metformin and potassium supplementation  Please bring home blood pressure monitor to the office for your next visit  Limit your sodium (Salt) intake   Please check your hemoglobin A1c every 3 months

## 2011-07-25 ENCOUNTER — Encounter (HOSPITAL_COMMUNITY)
Admission: RE | Admit: 2011-07-25 | Discharge: 2011-07-25 | Disposition: A | Discharge status: Home / Self Care | Payer: Medicare Other | Source: Ambulatory Visit | Attending: Nephrology | Admitting: Nephrology

## 2011-07-25 MED ORDER — FERUMOXYTOL INJECTION 510 MG/17 ML
510.0000 mg | INTRAVENOUS | Status: AC
  Administered 2011-07-25: 510 mg via INTRAVENOUS

## 2011-07-25 MED ORDER — FERUMOXYTOL INJECTION 510 MG/17 ML
INTRAVENOUS | Status: AC
  Administered 2011-07-25: 510 mg via INTRAVENOUS
  Filled 2011-07-25: qty 17

## 2011-07-25 MED ORDER — SODIUM CHLORIDE 0.9 % IV SOLN
INTRAVENOUS | Status: AC
  Administered 2011-07-25: 11:00:00 via INTRAVENOUS

## 2011-08-23 ENCOUNTER — Telehealth: Payer: Self-pay | Admitting: Cardiovascular Disease

## 2011-08-23 ENCOUNTER — Ambulatory Visit: Payer: Medicare Other | Admitting: Internal Medicine

## 2011-08-23 DIAGNOSIS — I5023 Acute on chronic systolic (congestive) heart failure: Secondary | ICD-10-CM

## 2011-08-23 DIAGNOSIS — I2510 Atherosclerotic heart disease of native coronary artery without angina pectoris: Secondary | ICD-10-CM

## 2011-08-23 MED ORDER — HYDRALAZINE HCL 50 MG PO TABS: ORAL_TABLET | ORAL | Status: DC

## 2011-08-23 NOTE — Telephone Encounter (Signed)
I spoke with the pt and made him aware that Hydralazine does not come in a 75mg  tablet. The pt should continue taking Hydralazine 50mg  take one and one-half tablet daily.

## 2011-08-23 NOTE — Telephone Encounter (Signed)
New problem:  Patient wife calling hydralazine 50 mg can be changed to 75 mg tablet , so it won't be cut in half.

## 2011-08-23 NOTE — Telephone Encounter (Signed)
New refill Pt called and said he wants 90 day supply of hydralazine [facility] to [facility] [address]. He takes one and half pills three times per day.

## 2011-08-23 NOTE — Telephone Encounter (Signed)
Refilled medication with a 90 day supply

## 2011-08-25 ENCOUNTER — Encounter: Payer: Self-pay | Admitting: Internal Medicine

## 2011-08-25 ENCOUNTER — Telehealth: Payer: Self-pay | Admitting: Internal Medicine

## 2011-08-25 ENCOUNTER — Ambulatory Visit (INDEPENDENT_AMBULATORY_CARE_PROVIDER_SITE_OTHER): Payer: Medicare Other | Admitting: Internal Medicine

## 2011-08-25 VITALS — BP 160/90 | Temp 98.3°F | Wt 277.0 lb

## 2011-08-25 DIAGNOSIS — I110 Hypertensive heart disease with heart failure: Secondary | ICD-10-CM

## 2011-08-25 DIAGNOSIS — E119 Type 2 diabetes mellitus without complications: Secondary | ICD-10-CM

## 2011-08-25 DIAGNOSIS — I5032 Chronic diastolic (congestive) heart failure: Secondary | ICD-10-CM

## 2011-08-25 MED ORDER — OLMESARTAN MEDOXOMIL 40 MG PO TABS: 40.0000 mg | ORAL_TABLET | Freq: Every day | ORAL | Status: DC

## 2011-08-25 MED ORDER — ONETOUCH ULTRA SYSTEM W/DEVICE KIT: 1.0000 | PACK | Freq: Once | Status: AC

## 2011-08-25 MED ORDER — GLUCOSE BLOOD VI STRP: 1.0000 | ORAL_STRIP | Freq: Three times a day (TID) | Status: DC

## 2011-08-25 MED ORDER — AMLODIPINE BESYLATE 5 MG PO TABS: 5.0000 mg | ORAL_TABLET | Freq: Every day | ORAL | Status: DC

## 2011-08-25 MED ORDER — GLUCOSE BLOOD VI STRP: ORAL_STRIP | Status: DC

## 2011-08-25 MED ORDER — ONETOUCH ULTRASOFT LANCETS MISC: 1.0000 | Freq: Three times a day (TID) | Status: AC

## 2011-08-25 NOTE — Patient Instructions (Signed)
Limit your sodium (Salt) intake    It is important that you exercise regularly, at least 20 minutes 3 to 4 times per week.  If you develop chest pain or shortness of breath seek  medical attention.  You need to lose weight.  Consider a lower calorie diet and regular exercise. 

## 2011-08-25 NOTE — Telephone Encounter (Signed)
New rx's sent to [facility]

## 2011-08-25 NOTE — Telephone Encounter (Signed)
Pt needs one touch ultra blue meters , test strips and lancets call into [facility] [address]. Freestyle lite is not longer a preferred meter on pt  Ins plan.

## 2011-08-25 NOTE — Progress Notes (Signed)
  Subjective:    Patient ID: Matthew [name], male    DOB: 1950/12/24, 61 y.o.   MRN: [id number]  HPI  61 year old patient who is in today for followup. He has a history of chronic kidney disease and is now on basal and middle time insulin. He is followed by nephrology. He has a history of hypertension and has been on amlodipine in the past but was discontinued due to hypotension and worsening peripheral edema. He does have a history of diastolic heart failure. He has done well with modest glycemic control. Home blood pressure readings have been showing consistently high systolic readings. There's been some weight gain since his last visit here  Wt Readings from Last 3 Encounters:  08/25/11 277 lb (125.646 kg)  07/25/11 264 lb (119.75 kg)  07/18/11 264 lb (119.75 kg)    Review of Systems  Constitutional: Positive for unexpected weight change.       Objective:   Physical Exam  Constitutional: He is oriented to person, place, and time. He appears well-developed.       Blood pressure 160/70  Obese  HENT:  Head: Normocephalic.  Right Ear: External ear normal.  Left Ear: External ear normal.  Eyes: Conjunctivae and EOM are normal.  Neck: Normal range of motion.  Cardiovascular: Normal rate and normal heart sounds.   Pulmonary/Chest: Breath sounds normal.  Abdominal: Bowel sounds are normal.  Musculoskeletal: Normal range of motion. He exhibits edema. He exhibits no tenderness.       +1 edema  Neurological: He is alert and oriented to person, place, and time.  Psychiatric: He has a normal mood and affect. His behavior is normal.          Assessment & Plan:   Hypertension. Will add amlodipine 5 mg daily. He does have an appointment with nephrology in one month. Will recheck here in 2 months Diabetes follow up hemoglobin A1c in 2 months

## 2011-09-19 ENCOUNTER — Other Ambulatory Visit (HOSPITAL_COMMUNITY): Payer: Self-pay | Admitting: *Deleted

## 2011-09-21 ENCOUNTER — Encounter (HOSPITAL_COMMUNITY)
Admission: RE | Admit: 2011-09-21 | Discharge: 2011-09-21 | Disposition: A | Discharge status: Home / Self Care | Payer: Medicare Other | Source: Ambulatory Visit | Attending: Nephrology | Admitting: Nephrology

## 2011-09-21 DIAGNOSIS — D509 Iron deficiency anemia, unspecified: Secondary | ICD-10-CM | POA: Insufficient documentation

## 2011-09-21 MED ORDER — SODIUM CHLORIDE 0.9 % IV SOLN
INTRAVENOUS | Status: DC
  Administered 2011-09-21: 11:00:00 via INTRAVENOUS

## 2011-09-21 MED ORDER — FERUMOXYTOL INJECTION 510 MG/17 ML
510.0000 mg | INTRAVENOUS | Status: DC
  Administered 2011-09-21: 510 mg via INTRAVENOUS
  Filled 2011-09-21: qty 17

## 2011-09-23 ENCOUNTER — Other Ambulatory Visit (HOSPITAL_COMMUNITY): Payer: Self-pay | Admitting: *Deleted

## 2011-09-26 ENCOUNTER — Encounter (HOSPITAL_COMMUNITY)
Admission: RE | Admit: 2011-09-26 | Discharge: 2011-09-26 | Disposition: A | Discharge status: Home / Self Care | Payer: Medicare Other | Source: Ambulatory Visit | Attending: Nephrology | Admitting: Nephrology

## 2011-09-26 MED ORDER — SODIUM CHLORIDE 0.9 % IV SOLN
INTRAVENOUS | Status: AC
  Administered 2011-09-26: 09:00:00 via INTRAVENOUS

## 2011-09-26 MED ORDER — FERUMOXYTOL INJECTION 510 MG/17 ML
510.0000 mg | INTRAVENOUS | Status: AC
  Administered 2011-09-26: 510 mg via INTRAVENOUS
  Filled 2011-09-26: qty 17

## 2011-09-26 MED ORDER — FERUMOXYTOL INJECTION 510 MG/17 ML: 510.0000 mg | INTRAVENOUS | Status: DC

## 2011-10-06 ENCOUNTER — Ambulatory Visit (INDEPENDENT_AMBULATORY_CARE_PROVIDER_SITE_OTHER): Payer: Medicare Other | Admitting: Cardiovascular Disease

## 2011-10-06 VITALS — BP 170/76 | HR 56 | Ht 61.0 in | Wt 273.0 lb

## 2011-10-06 DIAGNOSIS — I2510 Atherosclerotic heart disease of native coronary artery without angina pectoris: Secondary | ICD-10-CM

## 2011-10-06 DIAGNOSIS — I5032 Chronic diastolic (congestive) heart failure: Secondary | ICD-10-CM

## 2011-10-06 NOTE — Patient Instructions (Signed)
Your physician wants you to follow-up in: 6 months with Dr. Cooper.  You will receive a reminder letter in the mail two months in advance. If you don't receive a letter, please call our office to schedule the follow-up appointment.   

## 2011-10-16 ENCOUNTER — Encounter: Payer: Self-pay | Admitting: Cardiovascular Disease

## 2011-10-16 NOTE — Progress Notes (Signed)
HPI:  61 year-old gentleman with diastolic heart failure, malignant HTN, and CKD Stage 3, presenting for follow-up evaluation.  He has been followed closely by Dr Lesia Hausen and Dr Briant Cedar for adjustment of antihypertensive meds. Home readings are improved with BP's ranging 114-168/52-69. Pt has received iron infusions for treatment of his anemia. His shortness of breath is better and he denies chest pain. Edema is also less than in past.   Outpatient Encounter Prescriptions as of 10/06/2011  Medication Sig Dispense Refill  . amLODipine (NORVASC) 5 MG tablet Take 5 mg by mouth daily.      Marland Kitchen aspirin EC 81 MG tablet Take 1 tablet (81 mg total) by mouth daily.  1 tablet  0  . atorvastatin (LIPITOR) 80 MG tablet Take 1 tablet (80 mg total) by mouth daily.  90 tablet  6  . Blood Glucose Monitoring Suppl (ONE TOUCH ULTRA SYSTEM KIT) W/DEVICE KIT 1 kit by Does not apply route once.  1 each  0  . carvedilol (COREG) 25 MG tablet 2 tabs (50mg  total) twice daily      . cloNIDine (CATAPRES) 0.1 MG tablet Take 1 tablet (0.1 mg total) by mouth 2 (two) times daily.  180 tablet  2  . fenofibrate 160 MG tablet TAKE 1 TABLET BY MOUTH EVERY DAY  90 tablet  1  . furosemide (LASIX) 80 MG tablet Take 160 mg by mouth 2 (two) times daily. 1 tablet in the morning and one  tablet in the early afternoon      . glucose blood (ONE TOUCH ULTRA TEST) test strip 1 each by Other route 4 (four) times daily -  with meals and at bedtime. Use as instructed  100 each  12  . hydrALAZINE (APRESOLINE) 50 MG tablet Take one and one-half tablet by mouth three times a day  405 tablet  3  . insulin aspart (NOVOLOG FLEXPEN) 100 UNIT/ML injection As directed sliding scale  10 mL  6  . insulin glargine (LANTUS SOLOSTAR) 100 UNIT/ML injection Inject 12 Units into the skin at bedtime.  10 mL  6  . Insulin Pen Needle 29G X 12.7MM MISC by Does not apply route daily.        . isosorbide mononitrate (IMDUR) 60 MG 24 hr tablet Take 1 tablet (60 mg  total) by mouth every morning.  90 tablet  3  . Lancets (ONETOUCH ULTRASOFT) lancets 1 each by Other route 4 (four) times daily -  with meals and at bedtime.  100 each  5  . nitroGLYCERIN (NITROSTAT) 0.4 MG SL tablet Place 1 tablet (0.4 mg total) under the tongue every 5 (five) minutes as needed.  25 tablet  3  . olmesartan (BENICAR) 40 MG tablet Take 1 tablet (40 mg total) by mouth daily.  90 tablet  6  . sodium chloride 0.9 % SOLN 500 mL with iron dextran complex 50 MG/ML SOLN Inject into the vein once.      . LOTEMAX 0.5 % ophthalmic suspension       . DISCONTD: amLODipine (NORVASC) 5 MG tablet Take 1 tablet (5 mg total) by mouth daily.  90 tablet  3    No Known Allergies  Past Medical History  Diagnosis Date  . CHOLELITHIASIS 03/25/2008  . CORONARY ARTERY DISEASE 10/26/2006  . DIABETES MELLITUS, TYPE II 10/26/2006  . HYPERLIPIDEMIA 10/26/2006  . HYPERTENSION 10/26/2006  . OBESITY, MORBID 01/23/2007  . OBSTRUCTIVE SLEEP APNEA 10/26/2006  . Chronic kidney disease, stage III (moderate)   .  Diastolic heart failure, NYHA class 2     ROS: Negative except as per HPI  BP 170/76  Pulse 56  Ht 5\' 1"  (1.549 m)  Wt 123.832 kg (273 lb)  BMI 51.58 kg/m2  PHYSICAL EXAM: Pt is alert and oriented, obese male in NAD HEENT: normal Neck: JVP - normal, carotids 2+= with soft bilateral bruits Lungs: CTA bilaterally CV: RRR without murmur or gallop Abd: soft, NT, Positive BS, no hepatomegaly Ext: trace pretibial edema bilaterally, distal pulses intact and equal Skin: warm/dry no rash  ASSESSMENT AND PLAN: 1. Diastolic heart failure, chronic. Appears stable with appropriate medical therapy. Education on diet and salt restriction given. Continue same meds for now.  2. Malignant HTN with CHF and CKD. Continue close BP monitoring. Further follow-up with Dr Briant Cedar.  3. CAD - stable without angina. On appropriate Rx with ASA and statin drug. Also on ARB in setting of diabetes.  4. Follow-up - 6  months.  Tonny Bollman 10/16/2011

## 2011-10-25 ENCOUNTER — Ambulatory Visit (INDEPENDENT_AMBULATORY_CARE_PROVIDER_SITE_OTHER): Payer: Medicare Other | Admitting: Internal Medicine

## 2011-10-25 ENCOUNTER — Encounter: Payer: Self-pay | Admitting: Internal Medicine

## 2011-10-25 VITALS — BP 138/70 | Temp 98.7°F | Wt 283.0 lb

## 2011-10-25 DIAGNOSIS — N189 Chronic kidney disease, unspecified: Secondary | ICD-10-CM

## 2011-10-25 DIAGNOSIS — E119 Type 2 diabetes mellitus without complications: Secondary | ICD-10-CM

## 2011-10-25 DIAGNOSIS — J069 Acute upper respiratory infection, unspecified: Secondary | ICD-10-CM

## 2011-10-25 LAB — HEMOGLOBIN A1C: Hgb A1c MFr Bld: 6.6 % — ABNORMAL HIGH (ref 4.6–6.5)

## 2011-10-25 MED ORDER — HYDROCODONE-HOMATROPINE 5-1.5 MG/5ML PO SYRP: 5.0000 mL | ORAL_SOLUTION | Freq: Four times a day (QID) | ORAL | Status: DC | PRN

## 2011-10-25 NOTE — Patient Instructions (Signed)
Get plenty of rest, Drink lots of  clear liquids, and use Tylenol for fever and discomfort.     Please check your hemoglobin A1c every 3 months

## 2011-10-25 NOTE — Progress Notes (Signed)
  Subjective:    Patient ID: Matthew [name], male    DOB: February 15, 1951, 61 y.o.   MRN: [id number]  HPI  61 year old patient with multiple medical problems including diabetes coronary artery disease and chronic kidney disease. He presents with a three-day history of nonproductive cough sore throat and postnasal drip. There's been no fever. His wife is recovering from a similar illness. Denies any wheezing or shortness of breath no fever or chills    Review of Systems  Constitutional: Negative for fever, chills, appetite change and fatigue.  HENT: Positive for congestion and sore throat. Negative for hearing loss, ear pain, trouble swallowing, neck stiffness, dental problem, voice change and tinnitus.   Eyes: Negative for pain, discharge and visual disturbance.  Respiratory: Positive for cough. Negative for chest tightness, wheezing and stridor.   Cardiovascular: Negative for chest pain, palpitations and leg swelling.  Gastrointestinal: Negative for nausea, vomiting, abdominal pain, diarrhea, constipation, blood in stool and abdominal distention.  Genitourinary: Negative for urgency, hematuria, flank pain, discharge, difficulty urinating and genital sores.  Musculoskeletal: Negative for myalgias, back pain, joint swelling, arthralgias and gait problem.  Skin: Negative for rash.  Neurological: Negative for dizziness, syncope, speech difficulty, weakness, numbness and headaches.  Hematological: Negative for adenopathy. Does not bruise/bleed easily.  Psychiatric/Behavioral: Negative for behavioral problems and dysphoric mood. The patient is not nervous/anxious.        Objective:   Physical Exam  Constitutional: He is oriented to person, place, and time. He appears well-developed.  HENT:  Head: Normocephalic.  Right Ear: External ear normal.  Left Ear: External ear normal.       Pharyngeal crowding; mild erythema of the oropharynx  Eyes: Conjunctivae and EOM are normal.  Neck: Normal range of  motion.  Cardiovascular: Normal rate and normal heart sounds.   Pulmonary/Chest: Effort normal. No respiratory distress. He has no wheezes. He has no rales.  Abdominal: Bowel sounds are normal.  Musculoskeletal: Normal range of motion. He exhibits no edema and no tenderness.  Neurological: He is alert and oriented to person, place, and time.  Psychiatric: He has a normal mood and affect. His behavior is normal.          Assessment & Plan:   Viral URI with cough. Will treat symptomatically Hypertension stable. Samples of Benicar dispensed Diabetes mellitus stable

## 2011-10-27 ENCOUNTER — Other Ambulatory Visit (HOSPITAL_COMMUNITY): Payer: Self-pay | Admitting: *Deleted

## 2011-10-27 ENCOUNTER — Other Ambulatory Visit: Payer: Self-pay | Admitting: Internal Medicine

## 2011-10-27 NOTE — Telephone Encounter (Signed)
Pt is requesting refill on hycodan cough syrup. Pt is going out of town. Pt [facility]. [facility] [address]

## 2011-10-27 NOTE — Telephone Encounter (Signed)
Spoke with pt- was just given rx on Tuesday - cant ok Rf at this time - he can call when out [place] I will get ok then.

## 2011-10-28 ENCOUNTER — Encounter (HOSPITAL_COMMUNITY)
Admission: RE | Admit: 2011-10-28 | Discharge: 2011-10-28 | Disposition: A | Discharge status: Home / Self Care | Payer: Medicare Other | Source: Ambulatory Visit | Attending: Nephrology | Admitting: Nephrology

## 2011-10-28 DIAGNOSIS — D509 Iron deficiency anemia, unspecified: Secondary | ICD-10-CM | POA: Insufficient documentation

## 2011-10-28 MED ORDER — SODIUM CHLORIDE 0.9 % IV SOLN
Freq: Once | INTRAVENOUS | Status: AC
  Administered 2011-10-28: 14:00:00 via INTRAVENOUS

## 2011-10-28 MED ORDER — FERUMOXYTOL INJECTION 510 MG/17 ML
510.0000 mg | Freq: Once | INTRAVENOUS | Status: AC
  Administered 2011-10-28: 510 mg via INTRAVENOUS
  Filled 2011-10-28: qty 17

## 2011-11-01 ENCOUNTER — Telehealth: Payer: Self-pay | Admitting: Internal Medicine

## 2011-11-01 DIAGNOSIS — J069 Acute upper respiratory infection, unspecified: Secondary | ICD-10-CM

## 2011-11-01 MED ORDER — HYDROCODONE-HOMATROPINE 5-1.5 MG/5ML PO SYRP: 5.0000 mL | ORAL_SOLUTION | Freq: Four times a day (QID) | ORAL | Status: AC | PRN

## 2011-11-01 NOTE — Telephone Encounter (Signed)
[facility] as directed - instructed pharmacy to call pt when ready for pick up , gave his cell #

## 2011-11-01 NOTE — Telephone Encounter (Signed)
Pt called stating he talked to Selena Batten about a refill on  HYDROcodone-homatropine (HYCODAN) 5-1.5 MG/5ML syrup and was told to call back with pharmacy.   [facility] [phone] Pt requesting to be contacted when called

## 2011-11-17 ENCOUNTER — Other Ambulatory Visit: Payer: Self-pay | Admitting: Internal Medicine

## 2011-11-17 MED ORDER — INSULIN ASPART 100 UNIT/ML ~~LOC~~ SOLN: SUBCUTANEOUS | Status: DC

## 2011-11-17 NOTE — Telephone Encounter (Signed)
Pt called req to get refill insulin aspart (NOVOLOG FLEXPEN) 100 UNIT/ML injection to [facility] [phone] option # 2 -pt name, dob, bcbs mem id#.   Pt also needs to get a partial script for Novolog [facility] [address] to last until mail order arrives. [phone],

## 2011-11-17 NOTE — Telephone Encounter (Signed)
Faxed to [facility] and [facility]

## 2011-11-21 ENCOUNTER — Telehealth: Payer: Self-pay

## 2011-11-21 NOTE — Telephone Encounter (Signed)
New rx sent to [facility] 

## 2011-11-21 NOTE — Telephone Encounter (Signed)
recv'd fax from [facility] - they need directions for novolog - "use as directed sliding scale " will not work - they need guidelines for dosing Please advise

## 2011-11-21 NOTE — Telephone Encounter (Signed)
4 units prior to each meal; if blood sugar is in excess of 200 add  additional 4 units

## 2011-11-23 ENCOUNTER — Telehealth: Payer: Self-pay | Admitting: Internal Medicine

## 2011-11-23 NOTE — Telephone Encounter (Signed)
Pt. Calling, [facility] has not received the instructions for his Novalog Pens and he is completely out.  He asked that I call them with instructions.  Same in EPIC, 4 units prior to each meal and if BS > 200 to add 4 additional units.  They are shipping out the pens with a 90 day supply and 3 refills.  Lelan said that he has previously been on a floating scale, 5, 10 or 15 units and asked that this be verified by MD.

## 2011-11-24 NOTE — Telephone Encounter (Signed)
Have  patient take  mealtime insulin as previously recommended

## 2011-11-24 NOTE — Telephone Encounter (Signed)
Pt aware, will discuss at next office visit

## 2011-11-29 ENCOUNTER — Telehealth: Payer: Self-pay | Admitting: Internal Medicine

## 2011-11-29 NOTE — Telephone Encounter (Signed)
Caller: Vishal/Patient; PCP: Eleonore Chiquito; CB#: [phone]; ; ; Call regarding High BS;   Pt stating at the end of June 2013  he ran out of his Novolog and started using a sample pen , but doesn't know what it was.  Did continue his Lantus.  Did  receive  his Novolog mail order and has been taking since 11/25/2011 but increased from 5-15  units before meal  to  20-25 units. BS have not been under control.  Usually  FBS  are  in the 100's , but since 11/27/2011   has been > 200.   Today, 11/29/2011,    FBS 171   and took  Novolog 20 units  before breakfast. At 1215  took  Novolog 20 units and had lunch - At 1500   BS 280.  During call BS 317. RN reached See in 4 hrs DISP for  glucose not within provider defined guidelines and not following treatment plan as prescribed  per Diabetes: Control Problems protocol - Dr Amador Cunas doesn't have appts for today and its after 4 PM . RN called office and spoke with  Andria Rhein / LPN / Dr Truett Perna nurse  and she advised OV 11/30/2011  and will call pt back if provider  has anymore instructions tonight .  RN discussed information with pt and appt sched for 0945  11/30/2011.

## 2011-11-29 NOTE — Telephone Encounter (Signed)
Dr. Amador Cunas aware and to keep appt in AM

## 2011-11-29 NOTE — Telephone Encounter (Signed)
Opened in error. Call sent to triage nurse instead.

## 2011-11-30 ENCOUNTER — Ambulatory Visit (INDEPENDENT_AMBULATORY_CARE_PROVIDER_SITE_OTHER): Payer: Medicare Other | Admitting: Internal Medicine

## 2011-11-30 ENCOUNTER — Encounter: Payer: Self-pay | Admitting: Internal Medicine

## 2011-11-30 VITALS — BP 120/80 | Temp 98.7°F | Wt 276.0 lb

## 2011-11-30 DIAGNOSIS — E119 Type 2 diabetes mellitus without complications: Secondary | ICD-10-CM

## 2011-11-30 DIAGNOSIS — I2510 Atherosclerotic heart disease of native coronary artery without angina pectoris: Secondary | ICD-10-CM

## 2011-11-30 LAB — GLUCOSE, POCT (MANUAL RESULT ENTRY): POC Glucose: 213 mg/dl — AB (ref 70–99)

## 2011-11-30 MED ORDER — INSULIN ASPART 100 UNIT/ML ~~LOC~~ SOLN: SUBCUTANEOUS | Status: DC

## 2011-11-30 MED ORDER — INSULIN GLARGINE 100 UNIT/ML ~~LOC~~ SOLN: SUBCUTANEOUS | Status: DC

## 2011-11-30 NOTE — Progress Notes (Signed)
  Subjective:    Patient ID: Matthew [name], male    DOB: 1951-01-27, 61 y.o.   MRN: [id number]  HPI  61 year old patient who is seen today for followup of diabetes. He had been out of NovoLog insulin for a period of time but since resuming blood sugars have been quite elevated in spite of up titration. In the past she had been well-controlled on Lantus 12 units at bedtime and 10-15 units of NovoLog preprandially. He's had a single nocturnal episode of hypoglycemia when he uptitrated Lantus to 20 units and do 2 hyperglycemia also took NovoLog prior to bedtime. At the present time he is uptitrated NovoLog to 15-25 units prior to meals. Blood sugars are still quite variable and often in the 200-300 range. In general he feels quite well and anticipates playing golf tomorrow.    Review of Systems  Constitutional: Negative for fever, chills, appetite change and fatigue.  HENT: Negative for hearing loss, ear pain, congestion, sore throat, trouble swallowing, neck stiffness, dental problem, voice change and tinnitus.   Eyes: Negative for pain, discharge and visual disturbance.  Respiratory: Negative for cough, chest tightness, wheezing and stridor.   Cardiovascular: Negative for chest pain, palpitations and leg swelling.  Gastrointestinal: Negative for nausea, vomiting, abdominal pain, diarrhea, constipation, blood in stool and abdominal distention.       Recent aggravation of reflux symptoms  Genitourinary: Negative for urgency, hematuria, flank pain, discharge, difficulty urinating and genital sores.  Musculoskeletal: Negative for myalgias, back pain, joint swelling, arthralgias and gait problem.  Skin: Negative for rash.  Neurological: Negative for dizziness, syncope, speech difficulty, weakness, numbness and headaches.  Hematological: Negative for adenopathy. Does not bruise/bleed easily.  Psychiatric/Behavioral: Negative for behavioral problems and dysphoric mood. The patient is not nervous/anxious.         Objective:   Physical Exam  Constitutional: He appears well-developed and well-nourished. No distress.       Blood pressure well controlled. No distress          Assessment & Plan:    Diabetes mellitus poor control we'll titrate the Lantus to 20 units. We'll increase NovoLog with sliding scale coverage. Recheck 2 weeks Worsening GERD. Will place on a more aggressive anti-reflux regimen and treat with short-term PPI therapy

## 2011-11-30 NOTE — Patient Instructions (Signed)
It is important that you exercise regularly, at least 20 minutes 3 to 4 times per week.  If you develop chest pain or shortness of breath seek  medical attention.  You need to lose weight.  Consider a lower calorie diet and regular exercise. 

## 2011-12-05 MED ORDER — INSULIN ASPART 100 UNIT/ML ~~LOC~~ SOLN: SUBCUTANEOUS | Status: DC

## 2011-12-14 ENCOUNTER — Telehealth: Payer: Self-pay | Admitting: Internal Medicine

## 2011-12-14 ENCOUNTER — Encounter: Payer: Self-pay | Admitting: Internal Medicine

## 2011-12-14 ENCOUNTER — Ambulatory Visit (INDEPENDENT_AMBULATORY_CARE_PROVIDER_SITE_OTHER): Payer: Medicare Other | Admitting: Internal Medicine

## 2011-12-14 VITALS — BP 122/80 | Temp 97.8°F | Wt 282.0 lb

## 2011-12-14 DIAGNOSIS — I2510 Atherosclerotic heart disease of native coronary artery without angina pectoris: Secondary | ICD-10-CM

## 2011-12-14 DIAGNOSIS — E119 Type 2 diabetes mellitus without complications: Secondary | ICD-10-CM

## 2011-12-14 MED ORDER — OMEPRAZOLE-SODIUM BICARBONATE 40-1100 MG PO CAPS: 1.0000 | ORAL_CAPSULE | Freq: Every day | ORAL | Status: DC

## 2011-12-14 MED ORDER — GLUCOSE BLOOD VI STRP: 1.0000 | ORAL_STRIP | Freq: Three times a day (TID) | Status: DC

## 2011-12-14 NOTE — Telephone Encounter (Signed)
Done - [facility] [address]

## 2011-12-14 NOTE — Telephone Encounter (Signed)
Please advise rx for zegerid

## 2011-12-14 NOTE — Progress Notes (Signed)
  Subjective:    Patient ID: Matthew [name], male    DOB: 1950/08/10, 61 y.o.   MRN: [id number]  HPI  61 year old patient who is seen today for followup of diabetes. He was seen here 2 weeks ago with uncontrolled diabetes in both his basal and bolus insulin dosing was adjusted. He has maintained improved glycemic control is still having some late PM hyperglycemia. No hypoglycemia. Fasting blood sugars are generally fairly normal.    Review of Systems     Objective:   Physical Exam  Constitutional:       Obese blood pressure 122/80 weight 282          Assessment & Plan:   Diabetes mellitus. Presently improved we'll continue present dosing and consider up titration of mealtime insulin. Recheck one month GERD. Improved Continue meds and antireflux regimen

## 2011-12-14 NOTE — Telephone Encounter (Signed)
Ok #30  RF 4

## 2011-12-14 NOTE — Telephone Encounter (Signed)
Done

## 2011-12-14 NOTE — Telephone Encounter (Signed)
Pt said that during last ov with Dr Amador Cunas 11/30/11, pcp was given samples of Zegerid. Pt says that samples have run out and pt is req a [facility] [address]

## 2011-12-14 NOTE — Telephone Encounter (Signed)
Pt calling back about different issue. States his rx for One Touch strips is written for 300. Needs to be for 400 to cover 90 days. And please send in to [facility] mail order - they are $40 cheaper than retail.

## 2011-12-14 NOTE — Patient Instructions (Signed)
Limit your sodium (Salt) intake   Please check your hemoglobin A1c every 3 months  You need to lose weight.  Consider a lower calorie diet and regular exercise. 

## 2012-01-12 ENCOUNTER — Telehealth: Payer: Self-pay | Admitting: Internal Medicine

## 2012-01-12 MED ORDER — INSULIN GLARGINE 100 UNIT/ML ~~LOC~~ SOLN: SUBCUTANEOUS | Status: DC

## 2012-01-12 NOTE — Telephone Encounter (Signed)
Caller: [name]/Patient; Patient Name: Matthew [name]; PCP: Eleonore Chiquito [facility]); Best Callback Phone Number: [phone] Pt is calling to get Lantus Solostar prescription sent to [facility] instead of local pharmacy. He can get more for a cheaper price with them. He has 2 pens left from current box.

## 2012-01-12 NOTE — Telephone Encounter (Signed)
done

## 2012-01-16 ENCOUNTER — Other Ambulatory Visit: Payer: Self-pay | Admitting: Internal Medicine

## 2012-01-25 ENCOUNTER — Encounter (HOSPITAL_COMMUNITY)
Admission: RE | Admit: 2012-01-25 | Discharge: 2012-01-25 | Disposition: A | Discharge status: Home / Self Care | Payer: Medicare Other | Source: Ambulatory Visit | Attending: Nephrology | Admitting: Nephrology

## 2012-01-25 ENCOUNTER — Ambulatory Visit (INDEPENDENT_AMBULATORY_CARE_PROVIDER_SITE_OTHER): Payer: Medicare Other | Admitting: Internal Medicine

## 2012-01-25 ENCOUNTER — Encounter: Payer: Self-pay | Admitting: Internal Medicine

## 2012-01-25 VITALS — BP 124/70 | Temp 97.9°F | Wt 295.0 lb

## 2012-01-25 DIAGNOSIS — I2510 Atherosclerotic heart disease of native coronary artery without angina pectoris: Secondary | ICD-10-CM

## 2012-01-25 DIAGNOSIS — N189 Chronic kidney disease, unspecified: Secondary | ICD-10-CM

## 2012-01-25 DIAGNOSIS — E119 Type 2 diabetes mellitus without complications: Secondary | ICD-10-CM

## 2012-01-25 DIAGNOSIS — D649 Anemia, unspecified: Secondary | ICD-10-CM

## 2012-01-25 DIAGNOSIS — D509 Iron deficiency anemia, unspecified: Secondary | ICD-10-CM | POA: Insufficient documentation

## 2012-01-25 DIAGNOSIS — Z23 Encounter for immunization: Secondary | ICD-10-CM

## 2012-01-25 MED ORDER — FERUMOXYTOL INJECTION 510 MG/17 ML
510.0000 mg | Freq: Once | INTRAVENOUS | Status: AC
  Administered 2012-01-25: 510 mg via INTRAVENOUS

## 2012-01-25 MED ORDER — OMEPRAZOLE-SODIUM BICARBONATE 40-1100 MG PO CAPS: 1.0000 | ORAL_CAPSULE | Freq: Every day | ORAL | Status: DC

## 2012-01-25 MED ORDER — SODIUM CHLORIDE 0.9 % IV SOLN
INTRAVENOUS | Status: DC
  Administered 2012-01-25: 13:00:00 via INTRAVENOUS

## 2012-01-25 MED ORDER — FERUMOXYTOL INJECTION 510 MG/17 ML
INTRAVENOUS | Status: AC
  Administered 2012-01-25: 510 mg via INTRAVENOUS
  Filled 2012-01-25: qty 17

## 2012-01-25 NOTE — Patient Instructions (Signed)
Limit your sodium (Salt) intake   Please check your hemoglobin A1c every 3 months    It is important that you exercise regularly, at least 20 minutes 3 to 4 times per week.  If you develop chest pain or shortness of breath seek  medical attention.   

## 2012-01-25 NOTE — Progress Notes (Signed)
Subjective:    Patient ID: Matthew [name], male    DOB: 07-14-1950, 61 y.o.   MRN: [id number]  HPI  61 year old patient who is seen today for followup of his type 2 diabetes. This has been well-controlled. He has hypertension OSA and is also followed closely by nephrology do to chronic kidney disease. He has significant secondary anemia and is now receiving both oral and parenteral iron therapies. He generally feels well. He has a history of prior lab band surgery and has reflux symptoms. No new concerns or complaints. He has coronary artery disease which has been stable. He has resumed the nocturnal CPAP which has improved blood pressure control. Amlodipine has been increased to 10 mg daily by nephrology  Past Medical History  Diagnosis Date  . CHOLELITHIASIS 03/25/2008  . CORONARY ARTERY DISEASE 10/26/2006  . DIABETES MELLITUS, TYPE II 10/26/2006  . HYPERLIPIDEMIA 10/26/2006  . HYPERTENSION 10/26/2006  . OBESITY, MORBID 01/23/2007  . OBSTRUCTIVE SLEEP APNEA 10/26/2006  . Chronic kidney disease, stage III (moderate)   . Diastolic heart failure, NYHA class 2     History   Social History  . Marital Status: Married    Spouse Name: N/A    Number of Children: N/A  . Years of Education: N/A   Occupational History  . Not on file.   Social History Main Topics  . Smoking status: Former Smoker    Quit date: 05/09/1978  . Smokeless tobacco: Never Used  . Alcohol Use: Yes     rarely  . Drug Use: No  . Sexually Active: Not on file   Other Topics Concern  . Not on file   Social History Narrative  . No narrative on file    Past Surgical History  Procedure Date  . Tonsillectomy   . Total knee arthroplasty   . Cataract extraction   . Cardiac catheterization   . Retinal detachment surgery   . Laparoscopic gastric banding     No family history on file.  No Known Allergies  Current Outpatient Prescriptions on File Prior to Visit  Medication Sig Dispense Refill  . aspirin EC 81 MG  tablet Take 1 tablet (81 mg total) by mouth daily.  1 tablet  0  . atorvastatin (LIPITOR) 80 MG tablet Take 1 tablet (80 mg total) by mouth daily.  90 tablet  6  . Blood Glucose Monitoring Suppl (ONE TOUCH ULTRA SYSTEM KIT) W/DEVICE KIT 1 kit by Does not apply route once.  1 each  0  . carvedilol (COREG) 25 MG tablet 2 tabs (50mg  total) twice daily      . cloNIDine (CATAPRES) 0.2 MG tablet Take 0.2 mg by mouth 2 (two) times daily.      . fenofibrate 160 MG tablet TAKE 1 TABLET BY MOUTH EVERY DAY  90 tablet  3  . ferrous sulfate 325 (65 FE) MG tablet Take 650 mg by mouth daily with breakfast.      . furosemide (LASIX) 80 MG tablet Take 160 mg by mouth 2 (two) times daily.       Marland Kitchen glucose blood (ONE TOUCH ULTRA TEST) test strip 1 each by Other route 4 (four) times daily -  with meals and at bedtime. Use as instructed  400 each  3  . hydrALAZINE (APRESOLINE) 50 MG tablet Take one and one-half tablet by mouth three times a day  405 tablet  3  . insulin aspart (NOVOLOG) 100 UNIT/ML injection BS<70-no insulin;  70<BS<150-10 units; 150<BS<200-15 units; 200<BS<250- 20  units; 250<BS<300- 25 units; BS>300 30 units.  3 vial  PRN  . insulin glargine (LANTUS SOLOSTAR) 100 UNIT/ML injection 20 units at bedtime  15 pen  5  . Insulin Pen Needle 29G X 12.7MM MISC by Does not apply route daily.        . isosorbide mononitrate (IMDUR) 60 MG 24 hr tablet Take 1 tablet (60 mg total) by mouth every morning.  90 tablet  3  . Lancets (ONETOUCH ULTRASOFT) lancets 1 each by Other route 4 (four) times daily -  with meals and at bedtime.  100 each  5  . LOTEMAX 0.5 % ophthalmic suspension       . nitroGLYCERIN (NITROSTAT) 0.4 MG SL tablet Place 1 tablet (0.4 mg total) under the tongue every 5 (five) minutes as needed.  25 tablet  3  . olmesartan (BENICAR) 40 MG tablet Take 1 tablet (40 mg total) by mouth daily.  90 tablet  6  . sodium chloride 0.9 % SOLN 500 mL with iron dextran complex 50 MG/ML SOLN Inject into the vein  once.      Marland Kitchen DISCONTD: omeprazole-sodium bicarbonate (ZEGERID) 40-1100 MG per capsule Take 1 capsule by mouth daily before breakfast.  30 capsule  4  . DISCONTD: metFORMIN (GLUCOPHAGE) 1000 MG tablet Take 1 tablet (1,000 mg total) by mouth 2 (two) times daily with a meal.  180 tablet  3  . DISCONTD: potassium chloride SA (K-DUR,KLOR-CON) 20 MEQ tablet Take 1 tablet (20 mEq total) by mouth 2 (two) times daily.  180 tablet  3    BP 124/70  Temp 97.9 F (36.6 C) (Oral)  Wt 295 lb (133.811 kg)      Review of Systems  Constitutional: Negative for fever, chills, appetite change and fatigue.  HENT: Negative for hearing loss, ear pain, congestion, sore throat, trouble swallowing, neck stiffness, dental problem, voice change and tinnitus.   Eyes: Negative for pain, discharge and visual disturbance.  Respiratory: Negative for cough, chest tightness, wheezing and stridor.   Cardiovascular: Negative for chest pain, palpitations and leg swelling.  Gastrointestinal: Negative for nausea, vomiting, abdominal pain, diarrhea, constipation, blood in stool and abdominal distention.  Genitourinary: Negative for urgency, hematuria, flank pain, discharge, difficulty urinating and genital sores.  Musculoskeletal: Negative for myalgias, back pain, joint swelling, arthralgias and gait problem.  Skin: Negative for rash.  Neurological: Negative for dizziness, syncope, speech difficulty, weakness, numbness and headaches.  Hematological: Negative for adenopathy. Does not bruise/bleed easily.  Psychiatric/Behavioral: Negative for behavioral problems and dysphoric mood. The patient is not nervous/anxious.        Objective:   Physical Exam  Constitutional: He is oriented to person, place, and time. He appears well-developed.       Blood pressure 110/70  Weight 295  HENT:  Head: Normocephalic.  Right Ear: External ear normal.  Left Ear: External ear normal.  Eyes: Conjunctivae normal and EOM are normal.    Neck: Normal range of motion.  Cardiovascular: Normal rate and normal heart sounds.   Pulmonary/Chest: Breath sounds normal.  Abdominal: Bowel sounds are normal.  Musculoskeletal: Normal range of motion. He exhibits no edema and no tenderness.  Neurological: He is alert and oriented to person, place, and time.  Psychiatric: He has a normal mood and affect. His behavior is normal.          Assessment & Plan:   Diabetes mellitus. Will check a hemoglobin A1c weight loss encouraged Hypertension well controlled. Continue present regimen Reflux. Samples provided  of PPI. We'll consider OTC Prilosec Dyslipidemia Coronary artery disease   Recheck 3 months

## 2012-02-28 ENCOUNTER — Other Ambulatory Visit: Payer: Self-pay | Admitting: Cardiovascular Disease

## 2012-04-04 ENCOUNTER — Ambulatory Visit (INDEPENDENT_AMBULATORY_CARE_PROVIDER_SITE_OTHER): Payer: Medicare Other | Admitting: Cardiovascular Disease

## 2012-04-04 ENCOUNTER — Encounter: Payer: Self-pay | Admitting: Cardiovascular Disease

## 2012-04-04 ENCOUNTER — Encounter (INDEPENDENT_AMBULATORY_CARE_PROVIDER_SITE_OTHER): Payer: Medicare Other

## 2012-04-04 VITALS — BP 140/50 | HR 64 | Ht 69.0 in | Wt 294.0 lb

## 2012-04-04 DIAGNOSIS — I6529 Occlusion and stenosis of unspecified carotid artery: Secondary | ICD-10-CM

## 2012-04-04 DIAGNOSIS — I2510 Atherosclerotic heart disease of native coronary artery without angina pectoris: Secondary | ICD-10-CM

## 2012-04-04 DIAGNOSIS — R0989 Other specified symptoms and signs involving the circulatory and respiratory systems: Secondary | ICD-10-CM

## 2012-04-04 DIAGNOSIS — I5032 Chronic diastolic (congestive) heart failure: Secondary | ICD-10-CM

## 2012-04-04 NOTE — Patient Instructions (Addendum)
Your physician recommends that you schedule a follow-up appointment in: 1 YEAR with Dr Excell Seltzer  Your physician has requested that you have a carotid duplex. This test is an ultrasound of the carotid arteries in your neck. It looks at blood flow through these arteries that supply the brain with blood. Allow one hour for this exam. There are no restrictions or special instructions.  Your physician recommends that you continue on your current medications as directed. Please refer to the Current Medication list given to you today.

## 2012-04-04 NOTE — Progress Notes (Signed)
HPI:  61 year-old gentleman with diastolic heart failure, malignant HTN, and CKD Stage 3, presenting for follow-up evaluation. The patient has no interval complaints since his last visit here in May of this year. His blood pressure has been better controlled. He has chronic dyspnea with no significant change. He denies chest pain or pressure, orthopnea, PND, or palpitations.  Outpatient Encounter Prescriptions as of 04/04/2012  Medication Sig Dispense Refill  . amLODipine (NORVASC) 10 MG tablet Take 10 mg by mouth daily.      Marland Kitchen aspirin EC 81 MG tablet Take 1 tablet (81 mg total) by mouth daily.  1 tablet  0  . atorvastatin (LIPITOR) 80 MG tablet Take 1 tablet (80 mg total) by mouth daily.  90 tablet  6  . Blood Glucose Monitoring Suppl (ONE TOUCH ULTRA SYSTEM KIT) W/DEVICE KIT 1 kit by Does not apply route once.  1 each  0  . carvedilol (COREG) 25 MG tablet 2 tabs (50mg  total) twice daily      . cloNIDine (CATAPRES) 0.2 MG tablet Take 0.2 mg by mouth 2 (two) times daily.      . fenofibrate 160 MG tablet TAKE 1 TABLET BY MOUTH EVERY DAY  90 tablet  3  . ferrous sulfate 325 (65 FE) MG tablet Take 1 tablet by mouth twice a day      . furosemide (LASIX) 80 MG tablet Take 160 mg by mouth 2 (two) times daily.       Marland Kitchen glucose blood (ONE TOUCH ULTRA TEST) test strip 1 each by Other route 4 (four) times daily -  with meals and at bedtime. Use as instructed  400 each  3  . hydrALAZINE (APRESOLINE) 50 MG tablet Take one and one-half tablet by mouth three times a day  405 tablet  3  . insulin aspart (NOVOLOG) 100 UNIT/ML injection BS<70-no insulin;  70<BS<150-10 units; 150<BS<200-15 units; 200<BS<250- 20 units; 250<BS<300- 25 units; BS>300 30 units.  3 vial  PRN  . insulin glargine (LANTUS SOLOSTAR) 100 UNIT/ML injection 20 units at bedtime  15 pen  5  . Insulin Pen Needle 29G X 12.7MM MISC by Does not apply route daily.        . isosorbide mononitrate (IMDUR) 60 MG 24 hr tablet TAKE 1 TABLET BY MOUTH  EVERY MORNING.  90 tablet  3  . Lancets (ONETOUCH ULTRASOFT) lancets 1 each by Other route 4 (four) times daily -  with meals and at bedtime.  100 each  5  . LOTEMAX 0.5 % ophthalmic suspension       . nitroGLYCERIN (NITROSTAT) 0.4 MG SL tablet Place 1 tablet (0.4 mg total) under the tongue every 5 (five) minutes as needed.  25 tablet  3  . olmesartan (BENICAR) 40 MG tablet Take 1 tablet (40 mg total) by mouth daily.  90 tablet  6  . omeprazole-sodium bicarbonate (ZEGERID) 40-1100 MG per capsule Take 1 capsule by mouth daily before breakfast.  90 capsule  3  . sodium chloride 0.9 % SOLN 500 mL with iron dextran complex 50 MG/ML SOLN Inject into the vein once.        No Known Allergies  Past Medical History  Diagnosis Date  . CHOLELITHIASIS 03/25/2008  . CORONARY ARTERY DISEASE 10/26/2006  . DIABETES MELLITUS, TYPE II 10/26/2006  . HYPERLIPIDEMIA 10/26/2006  . HYPERTENSION 10/26/2006  . OBESITY, MORBID 01/23/2007  . OBSTRUCTIVE SLEEP APNEA 10/26/2006  . Chronic kidney disease, stage III (moderate)   . Diastolic heart  failure, NYHA class 2    ROS: Negative except as per HPI  BP 140/50  Pulse 64  Ht 5\' 9"  (1.753 m)  Wt 133.358 kg (294 lb)  BMI 43.42 kg/m2  SpO2 96%  PHYSICAL EXAM: Pt is alert and oriented, obese male in NAD HEENT: normal Neck: JVP - normal, carotids 2+= with bilateral bruits Lungs: CTA bilaterally CV: RRR without murmur or gallop Abd: soft, NT, Positive BS, obese Ext: no C/C/E, distal pulses intact and equal Skin: Chronic bilateral stasis changes  EKG: Normal sinus rhythm 64 beats per minute, nonspecific intraventricular conduction delay, ST-T wave abnormality consider inferolateral ischemia.   ASSESSMENT AND PLAN: 1. Chronic diastolic heart failure. The patient remained stable on his current medical program. Blood pressure control is greatly improved.  2. Bilateral carotid bruits. I have not appreciated this on past exams. Will check a carotid duplex  scan.  3. Morbid obesity. We had a long discussion today about the need for weight loss with dietary modifications and initiation of an exercise program.  Tonny Bollman 04/04/2012 5:58 PM

## 2012-04-11 ENCOUNTER — Encounter: Payer: Self-pay | Admitting: Cardiovascular Disease

## 2012-04-11 ENCOUNTER — Other Ambulatory Visit: Payer: Self-pay | Admitting: Internal Medicine

## 2012-04-11 NOTE — Telephone Encounter (Signed)
New Problem:    Patient called in wanting to know the results of his latest carotid. Please call back.

## 2012-04-11 NOTE — Telephone Encounter (Signed)
This encounter was created in error - please disregard.

## 2012-04-20 ENCOUNTER — Encounter: Payer: Self-pay | Admitting: Internal Medicine

## 2012-04-20 ENCOUNTER — Ambulatory Visit (INDEPENDENT_AMBULATORY_CARE_PROVIDER_SITE_OTHER): Payer: Medicare Other | Admitting: Internal Medicine

## 2012-04-20 VITALS — BP 140/60 | HR 68 | Temp 97.8°F | Resp 20 | Wt 299.0 lb

## 2012-04-20 DIAGNOSIS — E119 Type 2 diabetes mellitus without complications: Secondary | ICD-10-CM

## 2012-04-20 DIAGNOSIS — N189 Chronic kidney disease, unspecified: Secondary | ICD-10-CM

## 2012-04-20 DIAGNOSIS — I2510 Atherosclerotic heart disease of native coronary artery without angina pectoris: Secondary | ICD-10-CM

## 2012-04-20 DIAGNOSIS — E785 Hyperlipidemia, unspecified: Secondary | ICD-10-CM

## 2012-04-20 NOTE — Patient Instructions (Signed)
Limit your sodium (Salt) intake    It is important that you exercise regularly, at least 20 minutes 3 to 4 times per week.  If you develop chest pain or shortness of breath seek  medical attention.  You need to lose weight.  Consider a lower calorie diet and regular exercise. 

## 2012-04-20 NOTE — Progress Notes (Signed)
Subjective:    Patient ID: Matthew [name], male    DOB: [birth year], 61 y.o.   MRN: [id number]  HPI  61 year old patient who is seen today for followup he is a recent cardiology evaluation as well as evaluation by nephrology. He has coronary artery disease and chronic kidney disease. He has treated hypertension and dyslipidemia. He has type 2 diabetes which has been well-controlled. Hemoglobin A1c is generally less than 7. He feels his glycemic control has been good. 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 Denies any focal neurological symptoms. He was seen by ophthalmology about 2 months ago  Past Medical History  Diagnosis Date  . CHOLELITHIASIS 03/25/2008  . CORONARY ARTERY DISEASE 10/26/2006  . DIABETES MELLITUS, TYPE II 10/26/2006  . HYPERLIPIDEMIA 10/26/2006  . HYPERTENSION 10/26/2006  . OBESITY, MORBID 01/23/2007  . OBSTRUCTIVE SLEEP APNEA 10/26/2006  . Chronic kidney disease, stage III (moderate)   . Diastolic heart failure, NYHA class 2     History   Social History  . Marital Status: Married    Spouse Name: N/A    Number of Children: N/A  . Years of Education: N/A   Occupational History  . Not on file.   Social History Main Topics  . Smoking status: Former Smoker    Quit date: 05/09/1978  . Smokeless tobacco: Never Used  . Alcohol Use: Yes     Comment: rarely  . Drug Use: No  . Sexually Active: Not on file   Other Topics Concern  . Not on file   Social History Narrative  . No narrative on file    Past Surgical History  Procedure Date  . Tonsillectomy   . Total knee arthroplasty   . Cataract extraction   . Cardiac catheterization   . Retinal detachment surgery   . Laparoscopic gastric banding     No family history on file.  No Known Allergies  Current Outpatient Prescriptions on File Prior to Visit  Medication Sig Dispense Refill  . amLODipine (NORVASC) 10 MG tablet  Take 10 mg by mouth daily.      Marland Kitchen aspirin EC 81 MG tablet Take 1 tablet (81 mg total) by mouth daily.  1 tablet  0  . atorvastatin (LIPITOR) 80 MG tablet TAKE 1 TABLET EVERY DAY  90 tablet  3  . Blood Glucose Monitoring Suppl (ONE TOUCH ULTRA SYSTEM KIT) W/DEVICE KIT 1 kit by Does not apply route once.  1 each  0  . carvedilol (COREG) 25 MG tablet 2 tabs (50mg  total) twice daily      . cloNIDine (CATAPRES) 0.2 MG tablet Take 0.2 mg by mouth 2 (two) times daily.      . fenofibrate 160 MG tablet TAKE 1 TABLET BY MOUTH EVERY DAY  90 tablet  3  . ferrous sulfate 325 (65 FE) MG tablet Take 1 tablet by mouth twice a day      . furosemide (LASIX) 80 MG tablet Take 160 mg by mouth 2 (two) times daily.       Marland Kitchen glucose blood (ONE TOUCH ULTRA TEST) test strip 1 each by Other route 4 (four) times daily -  with meals and at bedtime. Use as instructed  400 each  3  . hydrALAZINE (APRESOLINE) 50 MG tablet Take one and one-half tablet by mouth three times a day  405 tablet  3  . insulin aspart (NOVOLOG) 100 UNIT/ML injection BS<70-no insulin;  70<BS<150-10 units; 150<BS<200-15 units; 200<BS<250- 20 units; 250<BS<300- 25 units; BS>300 30 units.  3 vial  PRN  . insulin glargine (LANTUS SOLOSTAR) 100 UNIT/ML injection 20 units at bedtime  15 pen  5  . Insulin Pen Needle 29G X 12.7MM MISC by Does not apply route daily.        . isosorbide mononitrate (IMDUR) 60 MG 24 hr tablet TAKE 1 TABLET BY MOUTH EVERY MORNING.  90 tablet  3  . Lancets (ONETOUCH ULTRASOFT) lancets 1 each by Other route 4 (four) times daily -  with meals and at bedtime.  100 each  5  . LOTEMAX 0.5 % ophthalmic suspension       . nitroGLYCERIN (NITROSTAT) 0.4 MG SL tablet Place 1 tablet (0.4 mg total) under the tongue every 5 (five) minutes as needed.  25 tablet  3  . olmesartan (BENICAR) 40 MG tablet Take 1 tablet (40 mg total) by mouth daily.  90 tablet  6  . omeprazole-sodium bicarbonate (ZEGERID) 40-1100 MG per capsule Take 1 capsule by mouth  daily before breakfast.  90 capsule  3  . sodium chloride 0.9 % SOLN 500 mL with iron dextran complex 50 MG/ML SOLN Inject into the vein once.      . [DISCONTINUED] metFORMIN (GLUCOPHAGE) 1000 MG tablet Take 1 tablet (1,000 mg total) by mouth 2 (two) times daily with a meal.  180 tablet  3  . [DISCONTINUED] potassium chloride SA (K-DUR,KLOR-CON) 20 MEQ tablet Take 1 tablet (20 mEq total) by mouth 2 (two) times daily.  180 tablet  3    BP 140/60  Pulse 68  Temp 97.8 F (36.6 C) (Oral)  Resp 20  Wt 299 lb (135.626 kg)  SpO2 95%       Review of Systems  Constitutional: Negative for fever, chills, appetite change and fatigue.  HENT: Negative for hearing loss, ear pain, congestion, sore throat, trouble swallowing, neck stiffness, dental problem, voice change and tinnitus.   Eyes: Negative for pain, discharge and visual disturbance.  Respiratory: Negative for cough, chest tightness, wheezing and stridor.   Cardiovascular: Negative for chest pain, palpitations and leg swelling.  Gastrointestinal: Negative for nausea, vomiting, abdominal pain, diarrhea, constipation, blood in stool and abdominal distention.  Genitourinary: Negative for urgency, hematuria, flank pain, discharge, difficulty urinating and genital sores.  Musculoskeletal: Negative for myalgias, back pain, joint swelling, arthralgias and gait problem.  Skin: Negative for rash.  Neurological: Negative for dizziness, syncope, speech difficulty, weakness, numbness and headaches.  Hematological: Negative for adenopathy. Does not bruise/bleed easily.  Psychiatric/Behavioral: Negative for behavioral problems and dysphoric mood. The patient is not nervous/anxious.        Objective:   Physical Exam  Constitutional: He is oriented to person, place, and time. He appears well-developed.       Morbidly obese with weight 299 Blood pressure in the right 110/56 Blood pressure the left 120/55  HENT:  Head: Normocephalic.  Right Ear:  External ear normal.  Left Ear: External ear normal.  Eyes: Conjunctivae normal and EOM are normal.  Neck: Normal range of motion.       Right bruit  Cardiovascular: Normal rate and normal heart sounds.   Pulmonary/Chest: Breath sounds normal.  Abdominal: Bowel sounds are normal.  Musculoskeletal: Normal range of motion. He exhibits no edema and no tenderness.  Neurological: He is alert and oriented to person, place, and time.  Psychiatric: He has a normal mood and affect. His behavior is normal.  Assessment & Plan:   Diabetes mellitus. Will check a hemoglobin A1c Hypertension well controlled Dyslipidemia Coronary artery disease Moderate carotid artery disease  We'll continue aggressive risk factor modification

## 2012-04-24 ENCOUNTER — Other Ambulatory Visit (HOSPITAL_COMMUNITY): Payer: Self-pay | Admitting: *Deleted

## 2012-04-25 ENCOUNTER — Encounter (HOSPITAL_COMMUNITY)
Admission: RE | Admit: 2012-04-25 | Discharge: 2012-04-25 | Disposition: A | Discharge status: Home / Self Care | Payer: Medicare Other | Source: Ambulatory Visit | Attending: Nephrology | Admitting: Nephrology

## 2012-04-25 ENCOUNTER — Ambulatory Visit: Payer: Medicare Other | Admitting: Internal Medicine

## 2012-04-25 DIAGNOSIS — D509 Iron deficiency anemia, unspecified: Secondary | ICD-10-CM | POA: Insufficient documentation

## 2012-04-25 MED ORDER — FERUMOXYTOL INJECTION 510 MG/17 ML
510.0000 mg | INTRAVENOUS | Status: DC
  Administered 2012-04-25: 510 mg via INTRAVENOUS

## 2012-04-25 MED ORDER — SODIUM CHLORIDE 0.9 % IV SOLN
INTRAVENOUS | Status: DC
  Administered 2012-04-25: 10:00:00 via INTRAVENOUS

## 2012-04-25 MED ORDER — FERUMOXYTOL INJECTION 510 MG/17 ML
INTRAVENOUS | Status: AC
  Filled 2012-04-25: qty 17

## 2012-05-01 ENCOUNTER — Other Ambulatory Visit (HOSPITAL_COMMUNITY): Payer: Self-pay | Admitting: *Deleted

## 2012-05-03 ENCOUNTER — Encounter (HOSPITAL_COMMUNITY)
Admission: RE | Admit: 2012-05-03 | Discharge: 2012-05-03 | Disposition: A | Discharge status: Home / Self Care | Payer: Medicare Other | Source: Ambulatory Visit | Attending: Nephrology | Admitting: Nephrology

## 2012-05-03 MED ORDER — SODIUM CHLORIDE 0.9 % IV SOLN
INTRAVENOUS | Status: AC
  Administered 2012-05-03: 08:00:00 via INTRAVENOUS

## 2012-05-03 MED ORDER — FERUMOXYTOL INJECTION 510 MG/17 ML
510.0000 mg | INTRAVENOUS | Status: DC
  Administered 2012-05-03: 510 mg via INTRAVENOUS

## 2012-05-03 MED ORDER — FERUMOXYTOL INJECTION 510 MG/17 ML
INTRAVENOUS | Status: AC
  Administered 2012-05-03: 510 mg via INTRAVENOUS
  Filled 2012-05-03: qty 17

## 2012-06-08 ENCOUNTER — Telehealth: Payer: Self-pay | Admitting: Internal Medicine

## 2012-06-08 MED ORDER — GLUCOSE BLOOD VI STRP: 1.0000 | ORAL_STRIP | Freq: Every day | Status: AC

## 2012-06-08 NOTE — Telephone Encounter (Signed)
Pt notified Rx for Test strips sent to pharmacy as requested.

## 2012-06-08 NOTE — Telephone Encounter (Signed)
Patient called stating that his pharmacy would like a new rx for an order of 500 one touch ultra lite test strips testing 5 times per day and they need to be sent to [facility] on [address]. Please assist and inform patient when done.

## 2012-07-03 ENCOUNTER — Other Ambulatory Visit: Payer: Self-pay | Admitting: Internal Medicine

## 2012-07-19 ENCOUNTER — Encounter: Payer: Self-pay | Admitting: Internal Medicine

## 2012-07-19 ENCOUNTER — Ambulatory Visit (INDEPENDENT_AMBULATORY_CARE_PROVIDER_SITE_OTHER): Payer: Medicare Other | Admitting: Internal Medicine

## 2012-07-19 VITALS — BP 126/64 | HR 66 | Temp 97.9°F | Resp 18 | Wt 293.0 lb

## 2012-07-19 DIAGNOSIS — I5032 Chronic diastolic (congestive) heart failure: Secondary | ICD-10-CM

## 2012-07-19 DIAGNOSIS — N189 Chronic kidney disease, unspecified: Secondary | ICD-10-CM

## 2012-07-19 LAB — HEMOGLOBIN A1C: Hgb A1c MFr Bld: 7.1 % — ABNORMAL HIGH (ref 4.6–6.5)

## 2012-07-19 NOTE — Progress Notes (Signed)
Subjective:    Patient ID: Matthew [name], male    DOB: Apr 07, 1951, 62 y.o.   MRN: [id number]  HPI  62 year old patient who is seen today for followup. He has type 2 diabetes which has been controlled fairly well. He has morbid obesity. He is followed closely by nephrology do to chronic kidney disease. He has treated hypertension dyslipidemia coronary artery disease. He has history of chronic diastolic heart failure which has been stable. He denies any cardiopulmonary complaints. There is been some modest weight loss over the past 3 months. In general doing quite well. He's had recent lab by nephrology  Past Medical History  Diagnosis Date  . CHOLELITHIASIS 03/25/2008  . CORONARY ARTERY DISEASE 10/26/2006  . DIABETES MELLITUS, TYPE II 10/26/2006  . HYPERLIPIDEMIA 10/26/2006  . HYPERTENSION 10/26/2006  . OBESITY, MORBID 01/23/2007  . OBSTRUCTIVE SLEEP APNEA 10/26/2006  . Chronic kidney disease, stage III (moderate)   . Diastolic heart failure, NYHA class 2     History   Social History  . Marital Status: Married    Spouse Name: N/A    Number of Children: N/A  . Years of Education: N/A   Occupational History  . Not on file.   Social History Main Topics  . Smoking status: Former Smoker    Quit date: 05/09/1978  . Smokeless tobacco: Never Used  . Alcohol Use: Yes     Comment: rarely  . Drug Use: No  . Sexually Active: Not on file   Other Topics Concern  . Not on file   Social History Narrative  . No narrative on file    Past Surgical History  Procedure Laterality Date  . Tonsillectomy    . Total knee arthroplasty    . Cataract extraction    . Cardiac catheterization    . Retinal detachment surgery    . Laparoscopic gastric banding      History reviewed. No pertinent family history.  No Known Allergies  Current Outpatient Prescriptions on File Prior to Visit  Medication Sig Dispense Refill  . amLODipine (NORVASC) 10 MG tablet TAKE 1 TABLET BY MOUTH EVERY DAY  90  tablet  1  . aspirin EC 81 MG tablet Take 1 tablet (81 mg total) by mouth daily.  1 tablet  0  . atorvastatin (LIPITOR) 80 MG tablet TAKE 1 TABLET EVERY DAY  90 tablet  3  . Blood Glucose Monitoring Suppl (ONE TOUCH ULTRA SYSTEM KIT) W/DEVICE KIT 1 kit by Does not apply route once.  1 each  0  . carvedilol (COREG) 25 MG tablet TAKE 2 TABLETS (50 MG TOTAL) BY MOUTH 2 (TWO) TIMES DAILY WITH A MEAL.  360 tablet  1  . cloNIDine (CATAPRES) 0.2 MG tablet Take 0.2 mg by mouth 2 (two) times daily.      . fenofibrate 160 MG tablet TAKE 1 TABLET BY MOUTH EVERY DAY  90 tablet  3  . ferrous sulfate 325 (65 FE) MG tablet Take 1 tablet by mouth twice a day      . furosemide (LASIX) 80 MG tablet Take 160 mg by mouth 2 (two) times daily.       Marland Kitchen glucose blood (ONE TOUCH ULTRA TEST) test strip 1 each by Other route 5 (five) times daily. Use as instructed  500 each  3  . hydrALAZINE (APRESOLINE) 50 MG tablet Take one and one-half tablet by mouth three times a day  405 tablet  3  . insulin aspart (NOVOLOG) 100 UNIT/ML injection  BS<70-no insulin;  70<BS<150-10 units; 150<BS<200-15 units; 200<BS<250- 20 units; 250<BS<300- 25 units; BS>300 30 units.  3 vial  PRN  . insulin glargine (LANTUS SOLOSTAR) 100 UNIT/ML injection 20 units at bedtime  15 pen  5  . Insulin Pen Needle 29G X 12.7MM MISC by Does not apply route daily.        . isosorbide mononitrate (IMDUR) 60 MG 24 hr tablet TAKE 1 TABLET BY MOUTH EVERY MORNING.  90 tablet  3  . Lancets (ONETOUCH ULTRASOFT) lancets 1 each by Other route 4 (four) times daily -  with meals and at bedtime.  100 each  5  . nitroGLYCERIN (NITROSTAT) 0.4 MG SL tablet Place 1 tablet (0.4 mg total) under the tongue every 5 (five) minutes as needed.  25 tablet  3  . olmesartan (BENICAR) 40 MG tablet Take 1 tablet (40 mg total) by mouth daily.  90 tablet  6  . omeprazole-sodium bicarbonate (ZEGERID) 40-1100 MG per capsule Take 1 capsule by mouth daily before breakfast.  90 capsule  3  .  sodium chloride 0.9 % SOLN 500 mL with iron dextran complex 50 MG/ML SOLN Inject into the vein once.      . [DISCONTINUED] metFORMIN (GLUCOPHAGE) 1000 MG tablet Take 1 tablet (1,000 mg total) by mouth 2 (two) times daily with a meal.  180 tablet  3  . [DISCONTINUED] potassium chloride SA (K-DUR,KLOR-CON) 20 MEQ tablet Take 1 tablet (20 mEq total) by mouth 2 (two) times daily.  180 tablet  3   No current facility-administered medications on file prior to visit.    BP 126/64  Pulse 66  Temp(Src) 97.9 F (36.6 C) (Oral)  Resp 18  Wt 293 lb (132.904 kg)  BMI 43.25 kg/m2  SpO2 96%     Wt Readings from Last 3 Encounters:  07/19/12 293 lb (132.904 kg)  04/25/12 299 lb (135.626 kg)  04/20/12 299 lb (135.626 kg)    Review of Systems  Constitutional: Negative for fever, chills, appetite change and fatigue.  HENT: Negative for hearing loss, ear pain, congestion, sore throat, trouble swallowing, neck stiffness, dental problem, voice change and tinnitus.   Eyes: Negative for pain, discharge and visual disturbance.  Respiratory: Negative for cough, chest tightness, wheezing and stridor.   Cardiovascular: Negative for chest pain, palpitations and leg swelling.  Gastrointestinal: Negative for nausea, vomiting, abdominal pain, diarrhea, constipation, blood in stool and abdominal distention.  Genitourinary: Negative for urgency, hematuria, flank pain, discharge, difficulty urinating and genital sores.  Musculoskeletal: Negative for myalgias, back pain, joint swelling, arthralgias and gait problem.  Skin: Negative for rash.  Neurological: Negative for dizziness, syncope, speech difficulty, weakness, numbness and headaches.  Hematological: Negative for adenopathy. Does not bruise/bleed easily.  Psychiatric/Behavioral: Negative for behavioral problems and dysphoric mood. The patient is not nervous/anxious.        Objective:   Physical Exam  Constitutional: He is oriented to person, place, and  time. He appears well-developed.  Weight 293  Blood pressure 126/64  HENT:  Head: Normocephalic.  Right Ear: External ear normal.  Left Ear: External ear normal.  Eyes: Conjunctivae and EOM are normal.  Neck: Normal range of motion.  Cardiovascular: Normal rate and normal heart sounds.   Pulmonary/Chest: Breath sounds normal.  Abdominal: Bowel sounds are normal.  Musculoskeletal: Normal range of motion. He exhibits no edema and no tenderness.  Neurological: He is alert and oriented to person, place, and time.  Psychiatric: He has a normal mood and affect. His  behavior is normal.          Assessment & Plan:   Diabetes mellitus type 2. Will check a hemoglobin A1c Chronic kidney disease. Laboratory screen obtained 3 days ago Hypertension well controlled Coronary artery disease stable Hypertension well controlled  Recheck 3 months

## 2012-07-19 NOTE — Patient Instructions (Signed)
Limit your sodium (Salt) intake   Please check your hemoglobin A1c every 3 months  You need to lose weight.  Consider a lower calorie diet and regular exercise.    It is important that you exercise regularly, at least 20 minutes 3 to 4 times per week.  If you develop chest pain or shortness of breath seek  medical attention. 

## 2012-07-28 ENCOUNTER — Other Ambulatory Visit: Payer: Self-pay | Admitting: Internal Medicine

## 2012-08-09 ENCOUNTER — Telehealth: Payer: Self-pay | Admitting: *Deleted

## 2012-08-09 NOTE — Telephone Encounter (Signed)
Left message on voicemail to call office. Pt dropped paper off regarding Diabetic supplies.

## 2012-08-16 NOTE — Telephone Encounter (Signed)
Spoke to pt regarding paper dropped off discussing diabetic supplies Told pt Rx has on how many times testing a day and diagnosis. Pt said he mailed a log sheet with glucose readings on to Dr.K to put in his chart. Told pt I have not seen log and it is not in chart, I will check with Dr. Kirtland Bouchard when he returns. Pt verbalized understanding and stated that is fine do not need refills right now.

## 2012-08-22 NOTE — Telephone Encounter (Signed)
Left message on voicemail to call office. I checked with Dr. Amador Cunas he does not have glucose log sheet he said he probably threw away.

## 2012-08-23 NOTE — Telephone Encounter (Signed)
Spoke to pt told him Dr. Amador Cunas said he probably saw the log sheet but threw it away, did not know he had to keep it. Told pt next time send the log sheet attention to me and I will make sure it gets scanned into his chart. Pt verbalized understanding.

## 2012-09-12 ENCOUNTER — Ambulatory Visit (INDEPENDENT_AMBULATORY_CARE_PROVIDER_SITE_OTHER): Payer: Medicare Other | Admitting: Surgery

## 2012-09-12 ENCOUNTER — Encounter (INDEPENDENT_AMBULATORY_CARE_PROVIDER_SITE_OTHER): Payer: Self-pay | Admitting: Surgery

## 2012-09-12 VITALS — BP 150/64 | HR 64 | Temp 97.6°F | Resp 18 | Ht 68.5 in | Wt 297.4 lb

## 2012-09-12 DIAGNOSIS — Z9884 Bariatric surgery status: Secondary | ICD-10-CM

## 2012-09-12 NOTE — Progress Notes (Signed)
Mr. Brayton Mars returns to the urgent office with a history of significant reflux. Its gone bad at night and during the day when she eats things. He could either have an anterior slip or he could be too tight. I went ahead and remove 6 cc from his APL band and our like to see symptoms are affected over the next 4-6 weeks. I'll see him back at that time and if he is still having symptoms would likely get an upper GI series. After removing the fluid he is able to drink without burping and felt different.

## 2012-09-12 NOTE — Patient Instructions (Signed)
Thanks for your patience.  If you need further assistance after leaving the office, please call our office and speak with a [facility] nurse.  [phone].  If you want to leave a message for Dr. Ladanian Kelter, please call his office phone at [phone]. 

## 2012-10-18 ENCOUNTER — Encounter (INDEPENDENT_AMBULATORY_CARE_PROVIDER_SITE_OTHER): Payer: Federal, State, Local not specified - PPO | Admitting: Surgery

## 2012-10-19 ENCOUNTER — Ambulatory Visit: Payer: Medicare Other | Admitting: Internal Medicine

## 2012-11-04 ENCOUNTER — Other Ambulatory Visit: Payer: Self-pay | Admitting: Internal Medicine

## 2012-11-06 ENCOUNTER — Ambulatory Visit (INDEPENDENT_AMBULATORY_CARE_PROVIDER_SITE_OTHER): Payer: Medicare Other | Admitting: Internal Medicine

## 2012-11-06 ENCOUNTER — Encounter: Payer: Self-pay | Admitting: Internal Medicine

## 2012-11-06 DIAGNOSIS — I2510 Atherosclerotic heart disease of native coronary artery without angina pectoris: Secondary | ICD-10-CM

## 2012-11-06 DIAGNOSIS — G4733 Obstructive sleep apnea (adult) (pediatric): Secondary | ICD-10-CM

## 2012-11-06 DIAGNOSIS — E119 Type 2 diabetes mellitus without complications: Secondary | ICD-10-CM

## 2012-11-06 DIAGNOSIS — I10 Essential (primary) hypertension: Secondary | ICD-10-CM | POA: Insufficient documentation

## 2012-11-06 LAB — HEMOGLOBIN A1C: Hgb A1c MFr Bld: 7.1 % — ABNORMAL HIGH (ref 4.6–6.5)

## 2012-11-06 NOTE — Patient Instructions (Signed)
Limit your sodium (Salt) intake   Please check your hemoglobin A1c every 3 months  You need to lose weight.  Consider a lower calorie diet and regular exercise.    It is important that you exercise regularly, at least 20 minutes 3 to 4 times per week.  If you develop chest pain or shortness of breath seek  medical attention. 

## 2012-11-06 NOTE — Progress Notes (Signed)
  Subjective:    Patient ID: Matthew [name], male    DOB: 24-Nov-1950, 62 y.o.   MRN: [id number]  HPI  Wt Readings from Last 3 Encounters:  11/06/12 305 lb (138.347 kg)  09/12/12 297 lb 6.4 oz (134.9 kg)  07/19/12 293 lb (132.904 kg)    Review of Systems     Objective:   Physical Exam        Assessment & Plan:

## 2012-11-06 NOTE — Progress Notes (Signed)
Subjective:    Patient ID: Matthew [name], male    DOB: Nov 20, 1950, 62 y.o.   MRN: [id number]  HPI   62 year old patient who is seen for followup of his multiple medical problems. He has type 2 diabetes. Over the past year his weight and hemoglobin A1c is have been trending up. His last hemoglobin A1c 7.1. He is on basal insulin 20 units at bedtime and 20 units at mealtime insulin 3 times daily. He will add an extra 5 units of NovoLog if blood sugars are elevated He has a history of morbid obesity and is status post lap band surgery. He has had recent adjustment of his lap band with resolution of reflux symptoms History of hypertension coronary artery disease and chronic diastolic heart failure He is followed by nephrology do to chronic kidney disease.  Past Medical History  Diagnosis Date  . CHOLELITHIASIS 03/25/2008  . CORONARY ARTERY DISEASE 10/26/2006  . DIABETES MELLITUS, TYPE II 10/26/2006  . HYPERLIPIDEMIA 10/26/2006  . HYPERTENSION 10/26/2006  . OBESITY, MORBID 01/23/2007  . OBSTRUCTIVE SLEEP APNEA 10/26/2006  . Chronic kidney disease, stage III (moderate)   . Diastolic heart failure, NYHA class 2     History   Social History  . Marital Status: Married    Spouse Name: N/A    Number of Children: N/A  . Years of Education: N/A   Occupational History  . Not on file.   Social History Main Topics  . Smoking status: Former Smoker    Quit date: 05/09/1978  . Smokeless tobacco: Never Used  . Alcohol Use: Yes     Comment: rarely  . Drug Use: No  . Sexually Active: Not on file   Other Topics Concern  . Not on file   Social History Narrative  . No narrative on file    Past Surgical History  Procedure Laterality Date  . Tonsillectomy    . Total knee arthroplasty    . Cataract extraction    . Cardiac catheterization    . Retinal detachment surgery    . Laparoscopic gastric banding      History reviewed. No pertinent family history.  No Known Allergies  Current  Outpatient Prescriptions on File Prior to Visit  Medication Sig Dispense Refill  . amLODipine (NORVASC) 10 MG tablet TAKE 1 TABLET BY MOUTH EVERY DAY  90 tablet  1  . aspirin EC 81 MG tablet Take 1 tablet (81 mg total) by mouth daily.  1 tablet  0  . atorvastatin (LIPITOR) 80 MG tablet TAKE 1 TABLET EVERY DAY  90 tablet  3  . BENICAR 40 MG tablet TAKE 1 TABLET BY MOUTH DAILY  90 tablet  1  . Blood Glucose Monitoring Suppl (ONE TOUCH ULTRA SYSTEM KIT) W/DEVICE KIT 1 kit by Does not apply route once.  1 each  0  . carvedilol (COREG) 25 MG tablet TAKE 2 TABLETS (50 MG TOTAL) BY MOUTH 2 (TWO) TIMES DAILY WITH A MEAL.  360 tablet  1  . cloNIDine (CATAPRES) 0.2 MG tablet Take 0.2 mg by mouth 2 (two) times daily.      . fenofibrate 160 MG tablet TAKE 1 TABLET BY MOUTH EVERY DAY  90 tablet  3  . ferrous sulfate 325 (65 FE) MG tablet Take 1 tablet by mouth twice a day      . furosemide (LASIX) 80 MG tablet Take 160 mg by mouth 2 (two) times daily.       Marland Kitchen glucose blood (ONE  TOUCH ULTRA TEST) test strip 1 each by Other route 5 (five) times daily. Use as instructed  500 each  3  . insulin aspart (NOVOLOG) 100 UNIT/ML injection BS<70-no insulin;  70<BS<150-10 units; 150<BS<200-15 units; 200<BS<250- 20 units; 250<BS<300- 25 units; BS>300 30 units.  3 vial  PRN  . insulin glargine (LANTUS SOLOSTAR) 100 UNIT/ML injection 20 units at bedtime  15 pen  5  . Insulin Pen Needle 29G X 12.7MM MISC by Does not apply route daily.        . isosorbide mononitrate (IMDUR) 60 MG 24 hr tablet TAKE 1 TABLET BY MOUTH EVERY MORNING.  90 tablet  3  . Lancets (ONETOUCH ULTRASOFT) lancets 1 each by Other route 4 (four) times daily -  with meals and at bedtime.  100 each  5  . nitroGLYCERIN (NITROSTAT) 0.4 MG SL tablet Place 1 tablet (0.4 mg total) under the tongue every 5 (five) minutes as needed.  25 tablet  3  . sodium chloride 0.9 % SOLN 500 mL with iron dextran complex 50 MG/ML SOLN Inject into the vein once.      .  [DISCONTINUED] metFORMIN (GLUCOPHAGE) 1000 MG tablet Take 1 tablet (1,000 mg total) by mouth 2 (two) times daily with a meal.  180 tablet  3  . [DISCONTINUED] potassium chloride SA (K-DUR,KLOR-CON) 20 MEQ tablet Take 1 tablet (20 mEq total) by mouth 2 (two) times daily.  180 tablet  3   No current facility-administered medications on file prior to visit.    BP 138/60  Pulse 59  Temp(Src) 97.8 F (36.6 C) (Oral)  Resp 20  Wt 305 lb (138.347 kg)  BMI 45.7 kg/m2  SpO2 97%       Review of Systems  Constitutional: Negative for fever, chills, appetite change and fatigue.  HENT: Negative for hearing loss, ear pain, congestion, sore throat, trouble swallowing, neck stiffness, dental problem, voice change and tinnitus.   Eyes: Negative for pain, discharge and visual disturbance.  Respiratory: Negative for cough, chest tightness, wheezing and stridor.   Cardiovascular: Negative for chest pain, palpitations and leg swelling.  Gastrointestinal: Negative for nausea, vomiting, abdominal pain, diarrhea, constipation, blood in stool and abdominal distention.  Genitourinary: Negative for urgency, hematuria, flank pain, discharge, difficulty urinating and genital sores.  Musculoskeletal: Positive for back pain and arthralgias. Negative for myalgias, joint swelling and gait problem.  Skin: Negative for rash.  Neurological: Negative for dizziness, syncope, speech difficulty, weakness, numbness and headaches.  Hematological: Negative for adenopathy. Does not bruise/bleed easily.  Psychiatric/Behavioral: Negative for behavioral problems and dysphoric mood. The patient is not nervous/anxious.        Objective:   Physical Exam  Constitutional: He is oriented to person, place, and time. He appears well-developed.  Weight 305 Blood pressure 130/60  HENT:  Head: Normocephalic.  Right Ear: External ear normal.  Left Ear: External ear normal.  Eyes: Conjunctivae and EOM are normal.  Neck: Normal  range of motion.  Cardiovascular: Normal rate and normal heart sounds.   Pulmonary/Chest: Breath sounds normal.  Abdominal: Bowel sounds are normal.  Musculoskeletal: Normal range of motion. He exhibits edema. He exhibits no tenderness.  Neurological: He is alert and oriented to person, place, and time.  Decreased vibratory sensation  Psychiatric: He has a normal mood and affect. His behavior is normal.          Assessment & Plan:   Diabetes mellitus. Will check a hemoglobin A1c. Exercise weight loss better eating habits all encouraged  Hypertension. Well controlled on multiple medications Coronary artery disease Chronic kidney disease. Followup nephrology Morbid obesity

## 2012-12-10 ENCOUNTER — Ambulatory Visit (INDEPENDENT_AMBULATORY_CARE_PROVIDER_SITE_OTHER): Payer: Medicare Other | Admitting: Family Medicine

## 2012-12-10 ENCOUNTER — Encounter: Payer: Self-pay | Admitting: Family Medicine

## 2012-12-10 ENCOUNTER — Telehealth: Payer: Self-pay | Admitting: Internal Medicine

## 2012-12-10 VITALS — BP 138/70 | Temp 98.0°F | Wt 305.0 lb

## 2012-12-10 DIAGNOSIS — M549 Dorsalgia, unspecified: Secondary | ICD-10-CM

## 2012-12-10 DIAGNOSIS — S39012A Strain of muscle, fascia and tendon of lower back, initial encounter: Secondary | ICD-10-CM

## 2012-12-10 MED ORDER — TRAMADOL HCL 50 MG PO TABS: 50.0000 mg | ORAL_TABLET | Freq: Three times a day (TID) | ORAL | Status: DC | PRN

## 2012-12-10 NOTE — Telephone Encounter (Signed)
[facility] Triage Call Report Triage Record Num: [id number] Operator: Baldomero Lamy Patient Name: Matthew [name] Call Date & Time: 12/08/2012 10:35:33AM Patient Phone: [phone] PCP: Gordy Savers Patient Gender: Male PCP Fax : [phone] Patient DOB: 05/07/1951 [facility] Name: [facility]  Reason for Call: Caller: Lazar/Patient; PCP: [facility] CB#: [phone]; Call regarding Back Pain; Pt calling regarding back pain. Onset 12/03/12. Has been using "muscle cream" and massage and Tylenol-#3 500 mg. Not much help. Asking if some kind of muscle relaxer can be called in. No UTI sx. Can not make it to [place] office before close. See Provider w/in 24 hrs for: Mild to moderate pain in back with normal activitiy or rest AND not responding to 72 hrs of home care per Back Sx protocol. Called in Flexeril 5 mg po TID for back pain, #15, to Olivia, Rph at [phone]. Care advice given with call back parameters.  Protocol(s) Used: Back Symptoms Recommended Outcome per Protocol: See Provider within 72 Hours Reason for Outcome: Mild to moderate pain in back with normal activity or rest AND not responding to 72 hours of home care Care Advice: ~ Call provider if symptoms worsen or new symptoms develop. ~ Avoid heavy lifting, bending and twisting of the back, and prolonged sitting until evaluated by provider. Apply a cloth-covered cold or ice pack to the area for 20 minutes 4 to 8 times a day for relief of pain for the first 24-48 hours. After 24 to 48 hours of cold application, use a cloth-covered heat pack to the area for 20 minutes 3 to 4 times a day. ~ Sleep on a moderately firm mattress. Try sleeping on back with a pillow placed under knees or sleep on side with knees bent and a pillow between knees. When lying on stomach, place pillow under the abdomen and pelvis; do not use a pillow for your head. ~ Go to the ED if you have worsening pain,  numbness or weakness of arms or legs, cannot walk, or have new unexplained changes in bladder or bowel function. Another adult should drive. ~ ~ Avoid activity that causes or worsens symptoms. ~ SYMPTOM / CONDITION MANAGEMENT ~ CAUTIONS Analgesic/Antipyretic Advice - Acetaminophen: Consider acetaminophen as directed on label or by pharmacist/provider for pain or fever PRECAUTIONS: - Use if there is no history of liver disease, alcoholism, or intake of three or more alcohol drinks per day - Only if approved by provider during pregnancy or when breastfeeding - During pregnancy, acetaminophen should not be taken more than 3 consecutive days without telling provider - Do not exceed recommended dose or frequency

## 2012-12-10 NOTE — Progress Notes (Signed)
Chief Complaint  Patient presents with  . Back Pain    HPI:  62 yo Pt of Dr. Amador Cunas here for acute visit for back pain: -started: reports has had back pain on and off for several years, but worse after switching beds about 1 week ago -symptoms: pain is R low back; constant -worse with: worse with certain positions at night and with certain movements with walking, occ in R buttock, 9/10 at worst -better with: heat or ice and sitting up, reports called PCP and given flexeril which did not help -denies: fevers, malaise, weakness or numbness in legs, bowel of bladder incontinence, radiation of pain to legs  -recently seen by PCP for chronic disease  ROS: See pertinent positives and negatives per HPI.  Past Medical History  Diagnosis Date  . CHOLELITHIASIS 03/25/2008  . CORONARY ARTERY DISEASE 10/26/2006  . DIABETES MELLITUS, TYPE II 10/26/2006  . HYPERLIPIDEMIA 10/26/2006  . HYPERTENSION 10/26/2006  . OBESITY, MORBID 01/23/2007  . OBSTRUCTIVE SLEEP APNEA 10/26/2006  . Chronic kidney disease, stage III (moderate)   . Diastolic heart failure, NYHA class 2     No family history on file.  History   Social History  . Marital Status: Married    Spouse Name: N/A    Number of Children: N/A  . Years of Education: N/A   Social History Main Topics  . Smoking status: Former Smoker    Quit date: 05/09/1978  . Smokeless tobacco: Never Used  . Alcohol Use: Yes     Comment: rarely  . Drug Use: No  . Sexually Active: None   Other Topics Concern  . None   Social History Narrative  . None    Current outpatient prescriptions:amLODipine (NORVASC) 10 MG tablet, TAKE 1 TABLET BY MOUTH EVERY DAY, Disp: 90 tablet, Rfl: 1;  aspirin EC 81 MG tablet, Take 1 tablet (81 mg total) by mouth daily., Disp: 1 tablet, Rfl: 0;  atorvastatin (LIPITOR) 80 MG tablet, TAKE 1 TABLET EVERY DAY, Disp: 90 tablet, Rfl: 3;  BENICAR 40 MG tablet, TAKE 1 TABLET BY MOUTH DAILY, Disp: 90 tablet, Rfl: 1 Blood  Glucose Monitoring Suppl (ONE TOUCH ULTRA SYSTEM KIT) W/DEVICE KIT, 1 kit by Does not apply route once., Disp: 1 each, Rfl: 0;  calcitRIOL (ROCALTROL) 0.25 MCG capsule, Take 0.25 mcg by mouth daily., Disp: , Rfl: ;  carvedilol (COREG) 25 MG tablet, TAKE 2 TABLETS (50 MG TOTAL) BY MOUTH 2 (TWO) TIMES DAILY WITH A MEAL., Disp: 360 tablet, Rfl: 1;  cloNIDine (CATAPRES) 0.2 MG tablet, Take 0.2 mg by mouth 2 (two) times daily., Disp: , Rfl:  fenofibrate 160 MG tablet, TAKE 1 TABLET BY MOUTH EVERY DAY, Disp: 90 tablet, Rfl: 3;  ferrous sulfate 325 (65 FE) MG tablet, Take 1 tablet by mouth twice a day, Disp: , Rfl: ;  furosemide (LASIX) 80 MG tablet, Take 160 mg by mouth 2 (two) times daily. , Disp: , Rfl: ;  glucose blood (ONE TOUCH ULTRA TEST) test strip, 1 each by Other route 5 (five) times daily. Use as instructed, Disp: 500 each, Rfl: 3 hydrALAZINE (APRESOLINE) 50 MG tablet, Take one tablet by mouth three times a day, Disp: , Rfl: ;  insulin aspart (NOVOLOG) 100 UNIT/ML injection, BS<70-no insulin;  70<BS<150-10 units; 150<BS<200-15 units; 200<BS<250- 20 units; 250<BS<300- 25 units; BS>300 30 units., Disp: 3 vial, Rfl: PRN;  insulin glargine (LANTUS SOLOSTAR) 100 UNIT/ML injection, 20 units at bedtime, Disp: 15 pen, Rfl: 5 Insulin Pen Needle 29G X 12.7MM  MISC, by Does not apply route daily.  , Disp: , Rfl: ;  isosorbide mononitrate (IMDUR) 60 MG 24 hr tablet, TAKE 1 TABLET BY MOUTH EVERY MORNING., Disp: 90 tablet, Rfl: 3;  Lancets (ONETOUCH ULTRASOFT) lancets, 1 each by Other route 4 (four) times daily -  with meals and at bedtime., Disp: 100 each, Rfl: 5 nitroGLYCERIN (NITROSTAT) 0.4 MG SL tablet, Place 1 tablet (0.4 mg total) under the tongue every 5 (five) minutes as needed., Disp: 25 tablet, Rfl: 3;  sodium chloride 0.9 % SOLN 500 mL with iron dextran complex 50 MG/ML SOLN, Inject into the vein once., Disp: , Rfl: ;  traMADol (ULTRAM) 50 MG tablet, Take 1 tablet (50 mg total) by mouth every 8 (eight) hours as  needed for pain., Disp: 20 tablet, Rfl: 0 [DISCONTINUED] metFORMIN (GLUCOPHAGE) 1000 MG tablet, Take 1 tablet (1,000 mg total) by mouth 2 (two) times daily with a meal., Disp: 180 tablet, Rfl: 3;  [DISCONTINUED] potassium chloride SA (K-DUR,KLOR-CON) 20 MEQ tablet, Take 1 tablet (20 mEq total) by mouth 2 (two) times daily., Disp: 180 tablet, Rfl: 3  EXAM:  Filed Vitals:   12/10/12 1326  BP: 138/70  Temp: 98 F (36.7 C)    Body mass index is 45.7 kg/(m^2).  GENERAL: vitals reviewed and listed above, alert, oriented, appears well hydrated and in no acute distress  HEENT: atraumatic, conjunttiva clear, no obvious abnormalities on inspection of external nose and ears  NECK: no obvious masses on inspection  MS: moves all extremities without noticeable abnormality Normal Gait Normal inspection of back, no obvious scoliosis or leg length descrepancy No bony TTP Soft tissue TTP at: bilat lumbar paraspinal muscles R>L and mild at R PSIS - increased pain with forward bending -/+ tests: neg trendelenburg,-facet loading, -SLRT, -CLRT, -FABER, -FADIR Normal muscle strength, sensation to light touch and DTRs in LEs bilaterally   PSYCH: pleasant and cooperative, no obvious depression or anxiety  ASSESSMENT AND PLAN:  Discussed the following assessment and plan:  Lumbar strain, initial encounter - Plan: traMADol (ULTRAM) 50 MG tablet  -suspect lumbar strain with possible R sacroiliitis though lest likely; neuro exam normal; HEP, conservative treatment, pain x - follow up in 3-4 weeks, return precautions -Patient advised to return or notify a doctor immediately if symptoms worsen or persist or new concerns arise.  Patient Instructions  -heat for 15 minutes twice dialy  -tylenol 500-1000mg   up to 3 times per day as needed for pain with topical sport cream menthol or capcasin   -tramadol for bad days if needed for pain  -home exercises at least 5 days per week  -follow up with your  doctor in 3-4 weeks     KIM, HANNAH R.

## 2012-12-10 NOTE — Patient Instructions (Signed)
-  heat for 15 minutes twice dialy  -tylenol 500-1000mg   up to 3 times per day as needed for pain with topical sport cream menthol or capcasin   -tramadol for bad days if needed for pain  -home exercises at least 5 days per week  -follow up with your doctor in 3-4 weeks

## 2012-12-26 ENCOUNTER — Other Ambulatory Visit: Payer: Self-pay | Admitting: Internal Medicine

## 2012-12-31 ENCOUNTER — Other Ambulatory Visit: Payer: Self-pay | Admitting: Internal Medicine

## 2012-12-31 ENCOUNTER — Ambulatory Visit: Payer: Medicare Other | Admitting: Internal Medicine

## 2013-01-01 ENCOUNTER — Other Ambulatory Visit: Payer: Self-pay | Admitting: Internal Medicine

## 2013-01-01 ENCOUNTER — Telehealth: Payer: Self-pay | Admitting: Internal Medicine

## 2013-01-01 MED ORDER — INSULIN ASPART 100 UNIT/ML FLEXPEN: PEN_INJECTOR | SUBCUTANEOUS | Status: AC

## 2013-01-01 NOTE — Telephone Encounter (Signed)
Pt request refill of insulin aspart (NOVOLOG) 100 UNIT/ML injection Pt asked if you could please send this rx today, so he can get before he goes out of town. Someone has to be at the house to received or the med will go bad. [facility] 45 pins (9 boxes) 5 pins in a box

## 2013-01-01 NOTE — Telephone Encounter (Signed)
Spoke to pt asked him if using vials or pens? Pt stated he is using Flexpens. Told him okay will send Rx to pharmacy. Rx faxed to [facility].

## 2013-01-04 NOTE — Telephone Encounter (Signed)
Reference number; [id number]. Matthew [name] called and stated that they needed to verify the instructions for the pt's insulin aspart (NOVOLOG FLEXPEN) 100 UNIT/ML SOPN FlexPen. Please assist.

## 2013-01-04 NOTE — Telephone Encounter (Signed)
Called [facility] and spoke to [place] wanted to clarify sliding scale directions on the prescription. Told her what I faxed over is correct. Breanna verbalized understanding.

## 2013-02-05 ENCOUNTER — Encounter: Payer: Self-pay | Admitting: Internal Medicine

## 2013-02-06 ENCOUNTER — Ambulatory Visit (INDEPENDENT_AMBULATORY_CARE_PROVIDER_SITE_OTHER): Payer: Medicare Other | Admitting: Internal Medicine

## 2013-02-06 ENCOUNTER — Encounter: Payer: Self-pay | Admitting: Internal Medicine

## 2013-02-06 DIAGNOSIS — Z860100 Personal history of colon polyps, unspecified: Secondary | ICD-10-CM

## 2013-02-06 DIAGNOSIS — N189 Chronic kidney disease, unspecified: Secondary | ICD-10-CM

## 2013-02-06 DIAGNOSIS — E785 Hyperlipidemia, unspecified: Secondary | ICD-10-CM

## 2013-02-06 DIAGNOSIS — I10 Essential (primary) hypertension: Secondary | ICD-10-CM

## 2013-02-06 DIAGNOSIS — E119 Type 2 diabetes mellitus without complications: Secondary | ICD-10-CM

## 2013-02-06 DIAGNOSIS — Z23 Encounter for immunization: Secondary | ICD-10-CM

## 2013-02-06 NOTE — Patient Instructions (Signed)
Limit your sodium (Salt) intake    It is important that you exercise regularly, at least 20 minutes 3 to 4 times per week.  If you develop chest pain or shortness of breath seek  medical attention.   Please check your hemoglobin A1c every 3 months  You need to lose weight.  Consider a lower calorie diet and regular exercise.   

## 2013-02-06 NOTE — Progress Notes (Signed)
Subjective:    Patient ID: Matthew [name], male    DOB: 1950/05/13, 62 y.o.   MRN: [id number]  HPI  62 year old patient who is in today for followup. He has type 2 diabetes which has been managed with basal and bolus insulin.  Last hemoglobin A1c 7.1. He has chronic kidney disease hypertension and coronary artery disease. He is morbid obesity and is status post lap band surgery. Gen. Dylan quite well although continues to gain weight. His examination within the past month He's had a colonoscopy this past summer  Past Medical History  Diagnosis Date  . CHOLELITHIASIS 03/25/2008  . CORONARY ARTERY DISEASE 10/26/2006  . DIABETES MELLITUS, TYPE II 10/26/2006  . HYPERLIPIDEMIA 10/26/2006  . HYPERTENSION 10/26/2006  . OBESITY, MORBID 01/23/2007  . OBSTRUCTIVE SLEEP APNEA 10/26/2006  . Chronic kidney disease, stage III (moderate)   . Diastolic heart failure, NYHA class 2     History   Social History  . Marital Status: Married    Spouse Name: N/A    Number of Children: N/A  . Years of Education: N/A   Occupational History  . Not on file.   Social History Main Topics  . Smoking status: Former Smoker    Quit date: 05/09/1978  . Smokeless tobacco: Never Used  . Alcohol Use: Yes     Comment: rarely  . Drug Use: No  . Sexual Activity: Not on file   Other Topics Concern  . Not on file   Social History Narrative  . No narrative on file    Past Surgical History  Procedure Laterality Date  . Tonsillectomy    . Total knee arthroplasty    . Cataract extraction    . Cardiac catheterization    . Retinal detachment surgery    . Laparoscopic gastric banding      History reviewed. No pertinent family history.  No Known Allergies  Current Outpatient Prescriptions on File Prior to Visit  Medication Sig Dispense Refill  . amLODipine (NORVASC) 10 MG tablet TAKE 1 TABLET BY MOUTH EVERY DAY  90 tablet  1  . amLODipine (NORVASC) 10 MG tablet TAKE 1 TABLET BY MOUTH EVERY DAY  90 tablet  1   . aspirin EC 81 MG tablet Take 1 tablet (81 mg total) by mouth daily.  1 tablet  0  . atorvastatin (LIPITOR) 80 MG tablet TAKE 1 TABLET EVERY DAY  90 tablet  3  . BENICAR 40 MG tablet TAKE 1 TABLET BY MOUTH DAILY  90 tablet  1  . Blood Glucose Monitoring Suppl (ONE TOUCH ULTRA SYSTEM KIT) W/DEVICE KIT 1 kit by Does not apply route once.  1 each  0  . calcitRIOL (ROCALTROL) 0.25 MCG capsule Take 0.25 mcg by mouth daily.      . carvedilol (COREG) 25 MG tablet TAKE 2 TABLETS (50 MG TOTAL) BY MOUTH 2 (TWO) TIMES DAILY WITH A MEAL.  360 tablet  1  . cloNIDine (CATAPRES) 0.2 MG tablet Take 0.2 mg by mouth 2 (two) times daily.      . fenofibrate 160 MG tablet TAKE 1 TABLET BY MOUTH EVERY DAY  90 tablet  3  . ferrous sulfate 325 (65 FE) MG tablet Take 1 tablet by mouth twice a day      . furosemide (LASIX) 80 MG tablet Take 160 mg by mouth 2 (two) times daily.       Marland Kitchen glucose blood (ONE TOUCH ULTRA TEST) test strip 1 each by Other route 5 (five)  times daily. Use as instructed  500 each  3  . insulin aspart (NOVOLOG FLEXPEN) 100 UNIT/ML SOPN FlexPen Check blood glucose three times a day before meals, use sliding scale BS<70-no insulin;  70<BS<150-10 units; 150<BS<200-15 units; 200<BS<250- 20 units; 250<BS<300- 25 units; BS>300 30 units  45 pen  3  . insulin glargine (LANTUS SOLOSTAR) 100 UNIT/ML injection 20 units at bedtime  15 pen  5  . Insulin Pen Needle 29G X 12.7MM MISC by Does not apply route daily.        . isosorbide mononitrate (IMDUR) 60 MG 24 hr tablet TAKE 1 TABLET BY MOUTH EVERY MORNING.  90 tablet  3  . Lancets (ONETOUCH ULTRASOFT) lancets 1 each by Other route 4 (four) times daily -  with meals and at bedtime.  100 each  5  . nitroGLYCERIN (NITROSTAT) 0.4 MG SL tablet Place 1 tablet (0.4 mg total) under the tongue every 5 (five) minutes as needed.  25 tablet  3  . sodium chloride 0.9 % SOLN 500 mL with iron dextran complex 50 MG/ML SOLN Inject into the vein once.      . [DISCONTINUED]  metFORMIN (GLUCOPHAGE) 1000 MG tablet Take 1 tablet (1,000 mg total) by mouth 2 (two) times daily with a meal.  180 tablet  3  . [DISCONTINUED] potassium chloride SA (K-DUR,KLOR-CON) 20 MEQ tablet Take 1 tablet (20 mEq total) by mouth 2 (two) times daily.  180 tablet  3   No current facility-administered medications on file prior to visit.    BP 122/56  Pulse 52  Temp(Src) 97.7 F (36.5 C) (Oral)  Resp 20  Wt 317 lb (143.79 kg)  BMI 47.49 kg/m2  SpO2 95%    Review of Systems  Constitutional: Positive for unexpected weight change. Negative for fever, chills, appetite change and fatigue.  HENT: Negative for hearing loss, ear pain, congestion, sore throat, trouble swallowing, neck stiffness, dental problem, voice change and tinnitus.   Eyes: Negative for pain, discharge and visual disturbance.  Respiratory: Negative for cough, chest tightness, wheezing and stridor.   Cardiovascular: Negative for chest pain, palpitations and leg swelling.  Gastrointestinal: Negative for nausea, vomiting, abdominal pain, diarrhea, constipation, blood in stool and abdominal distention.  Genitourinary: Negative for urgency, hematuria, flank pain, discharge, difficulty urinating and genital sores.  Musculoskeletal: Negative for myalgias, back pain, joint swelling, arthralgias and gait problem.  Skin: Negative for rash.  Neurological: Positive for numbness (paresthesias of the hands right greater than left). Negative for dizziness, syncope, speech difficulty, weakness and headaches.  Hematological: Negative for adenopathy. Does not bruise/bleed easily.  Psychiatric/Behavioral: Negative for behavioral problems and dysphoric mood. The patient is not nervous/anxious.        Objective:   Physical Exam  Constitutional: He is oriented to person, place, and time. He appears well-developed.  Weight 317 Blood pressure low normal   HENT:  Head: Normocephalic.  Right Ear: External ear normal.  Left Ear: External  ear normal.  Eyes: Conjunctivae and EOM are normal.  Neck: Normal range of motion.  Cardiovascular: Normal rate and normal heart sounds.   Pulmonary/Chest: Breath sounds normal.  Abdominal: Bowel sounds are normal.  Musculoskeletal: Normal range of motion. He exhibits edema. He exhibits no tenderness.  Neurological: He is alert and oriented to person, place, and time.  Absent vibratory sensation distally  Psychiatric: He has a normal mood and affect. His behavior is normal.          Assessment & Plan:   Diabetes  mellitus. We'll check a hemoglobin A1c weight loss encouraged no change in therapy Dyslipidemia. Continue statin therapy Chronic kidney disease. Followup nephrology CAD stable Morbid obesity/OSA. Weight loss encouraged  Recheck 3 months

## 2013-02-18 ENCOUNTER — Other Ambulatory Visit: Payer: Self-pay | Admitting: Cardiovascular Disease

## 2013-03-02 ENCOUNTER — Other Ambulatory Visit: Payer: Self-pay | Admitting: Cardiovascular Disease

## 2013-04-10 ENCOUNTER — Other Ambulatory Visit: Payer: Self-pay | Admitting: *Deleted

## 2013-04-10 MED ORDER — INSULIN GLARGINE 100 UNIT/ML ~~LOC~~ SOLN: SUBCUTANEOUS | Status: AC

## 2013-04-30 ENCOUNTER — Encounter: Payer: Self-pay | Admitting: Internal Medicine

## 2013-05-02 IMAGING — CR DG CHEST 2V
2 series · 2 of 2 positions shown · non-contrast
Comparison: 01/04/2010

CLINICAL DATA: Chest congestion.  CHF.

CHEST - 2 VIEW

[view not recorded (1 of 2)]
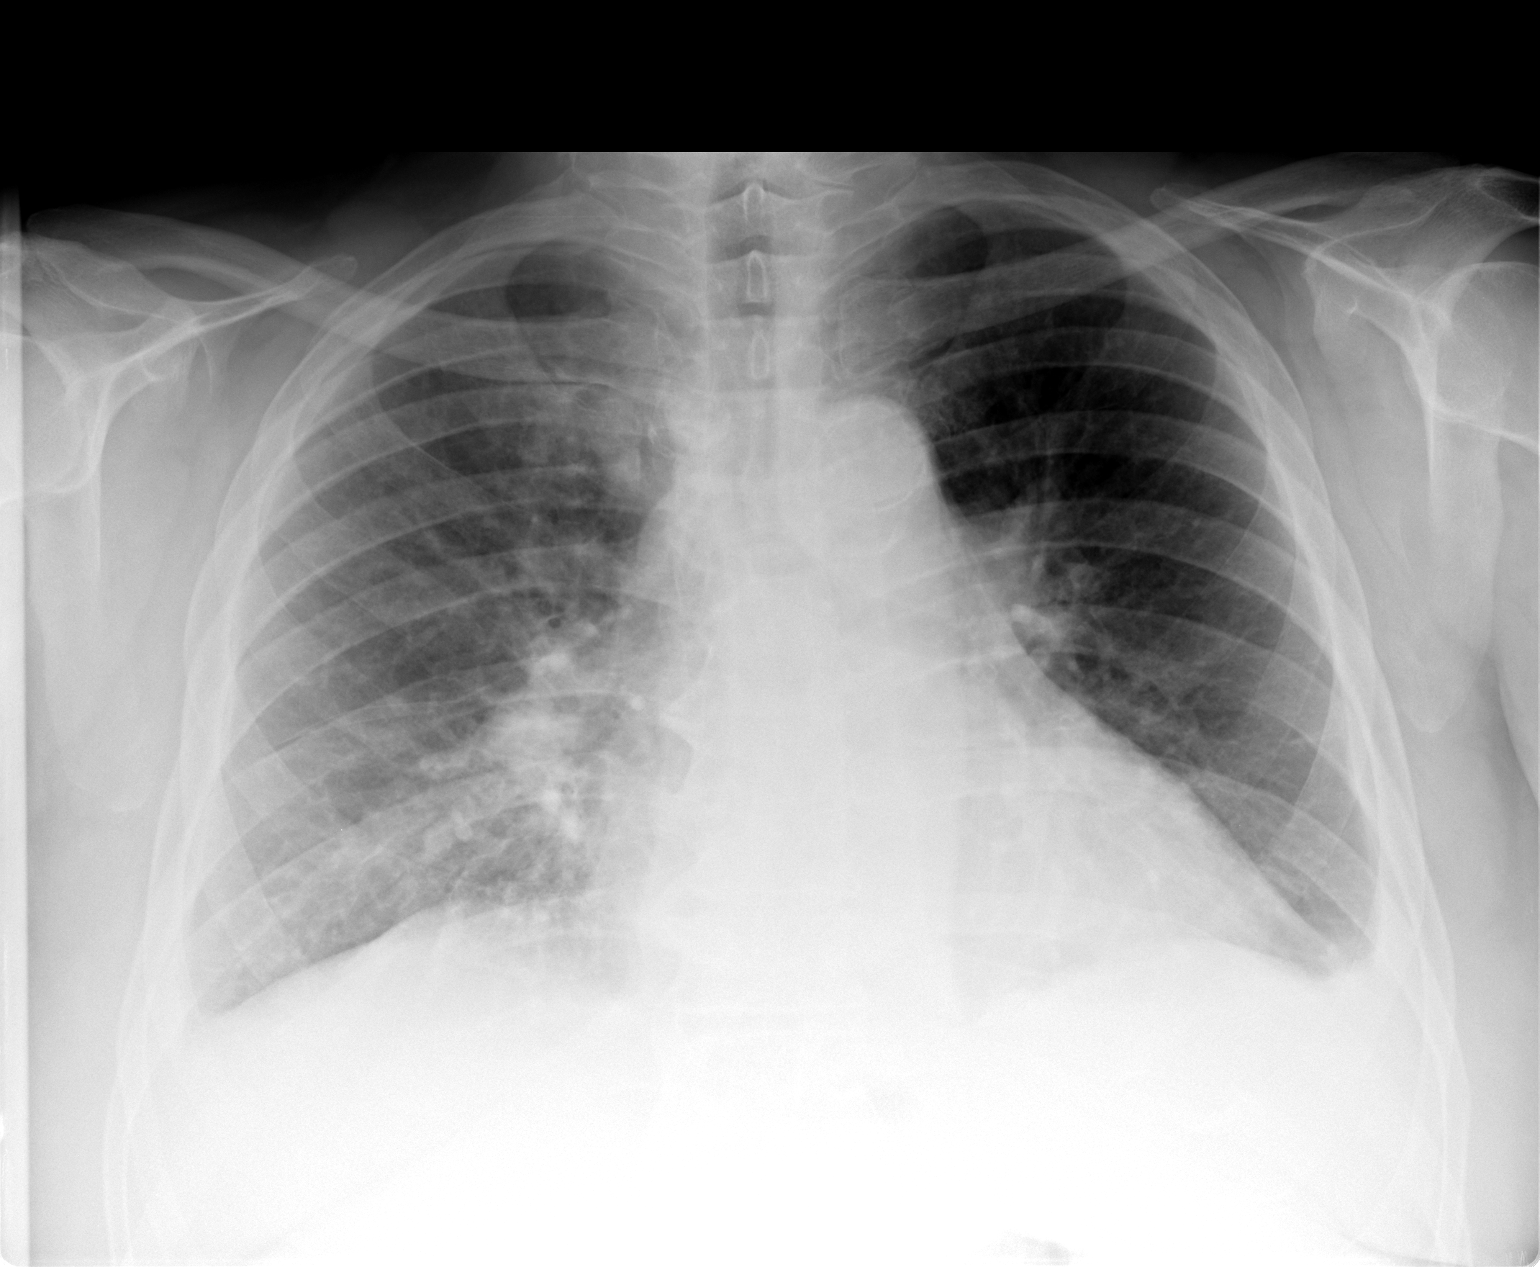

[view not recorded (2 of 2)]
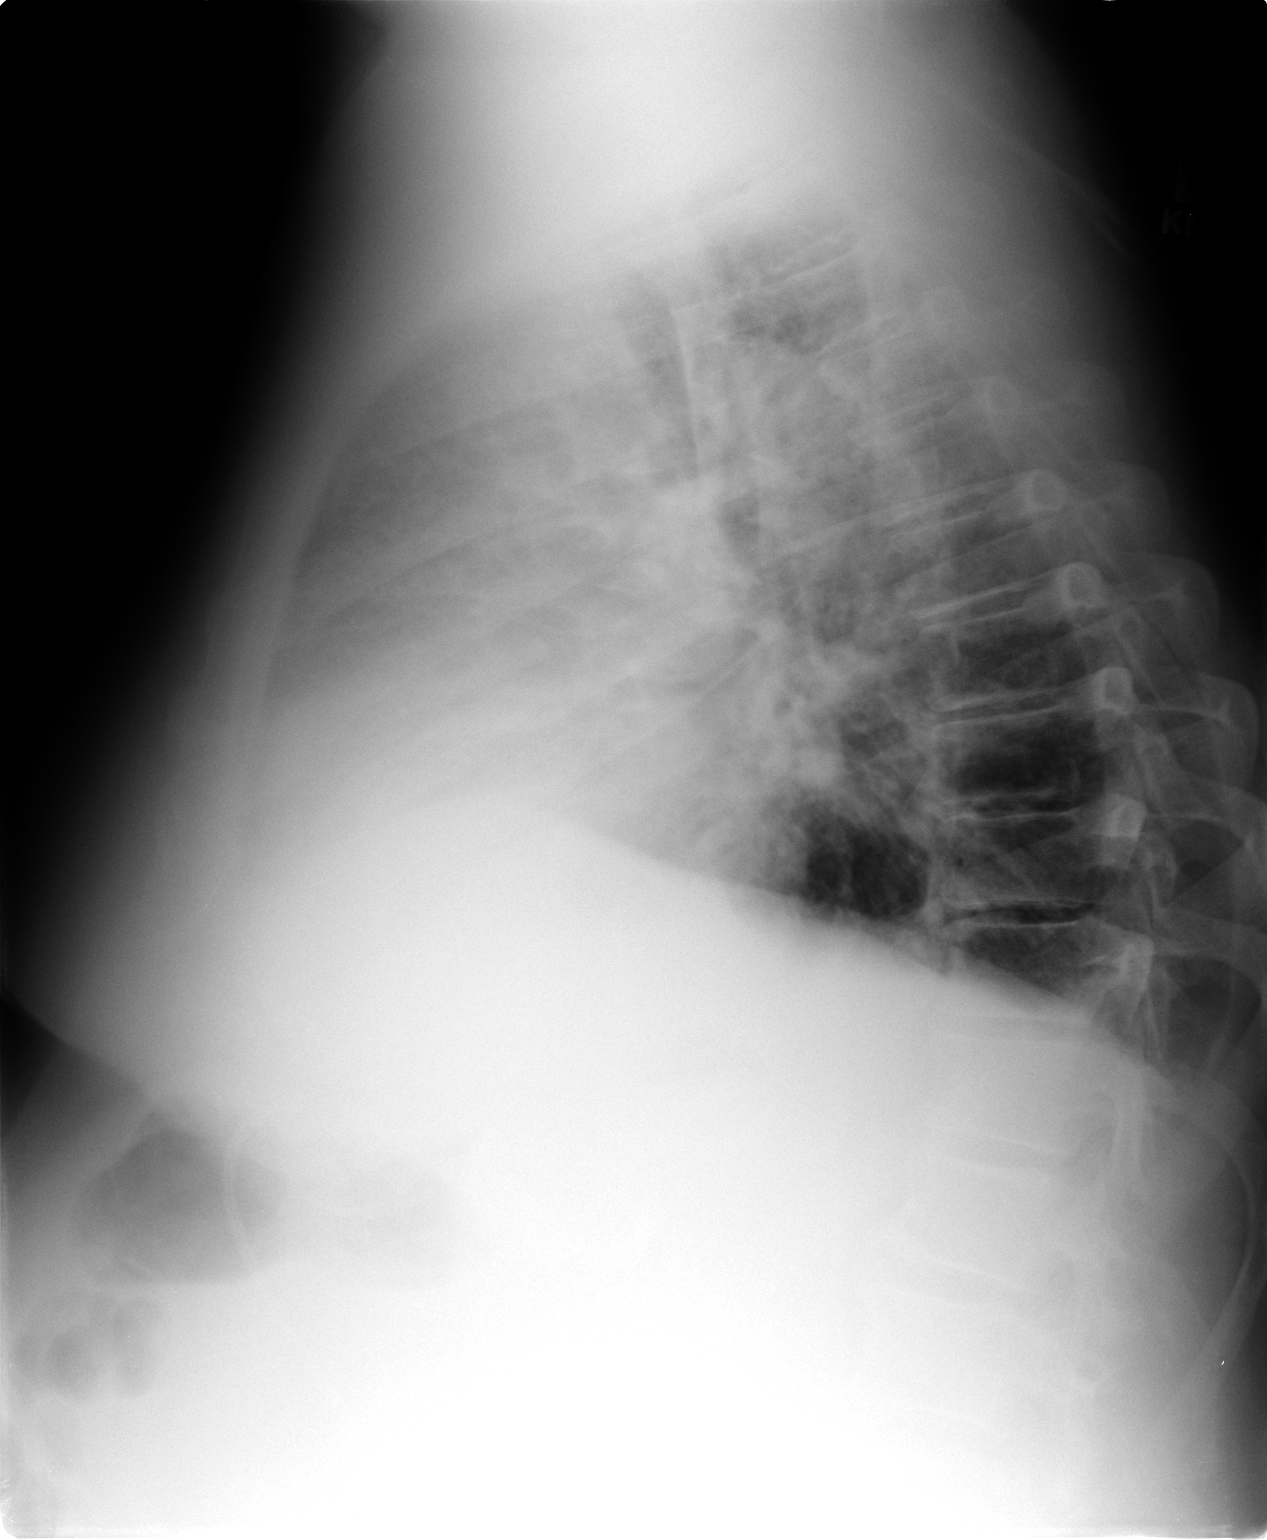

[2 of 2 positions shown; findings below may reference images not displayed]

FINDINGS: Cardiac enlargement.  Mild vascular congestion.  Negative
for edema.  Small pleural effusions bilaterally.

Mild right lower lobe airspace disease may represent atelectasis or
possibly pneumonia.
IMPRESSION: Right lower lobe airspace disease.

Mild  vascular congestion and small pleural effusions may represent
fluid overload.

## 2013-05-03 ENCOUNTER — Telehealth: Payer: Self-pay

## 2013-05-03 NOTE — Telephone Encounter (Signed)
Patient past away @ Home per Obituary in [place] [employer] °

## 2013-05-09 DEATH — deceased

## 2013-05-10 ENCOUNTER — Telehealth: Payer: Self-pay | Admitting: Internal Medicine

## 2013-05-10 ENCOUNTER — Ambulatory Visit: Payer: Medicare Other | Admitting: Internal Medicine

## 2013-05-10 NOTE — Telephone Encounter (Signed)
Called and discussed

## 2013-05-10 NOTE — Telephone Encounter (Signed)
Wife would like a call back from you. Would like to speak w/ you.

## 2013-05-10 NOTE — Telephone Encounter (Signed)
Please call Matthew [name], she would like to speak to you.

## 2013-05-20 ENCOUNTER — Encounter (HOSPITAL_COMMUNITY): Payer: Medicare Other

## 2013-05-20 ENCOUNTER — Ambulatory Visit: Payer: Medicare Other | Admitting: Cardiovascular Disease

## 2013-05-22 ENCOUNTER — Ambulatory Visit: Payer: Medicare Other | Admitting: Cardiovascular Disease

## 2013-05-22 ENCOUNTER — Encounter (HOSPITAL_COMMUNITY): Payer: Medicare Other

## 2013-08-21 IMAGING — CR DG CHEST 2V
2 series · 2 of 2 positions shown · non-contrast
Comparison: 03/01/2011

CLINICAL DATA: Cough, chest pain

CHEST - 2 VIEW

[view not recorded (1 of 2)]
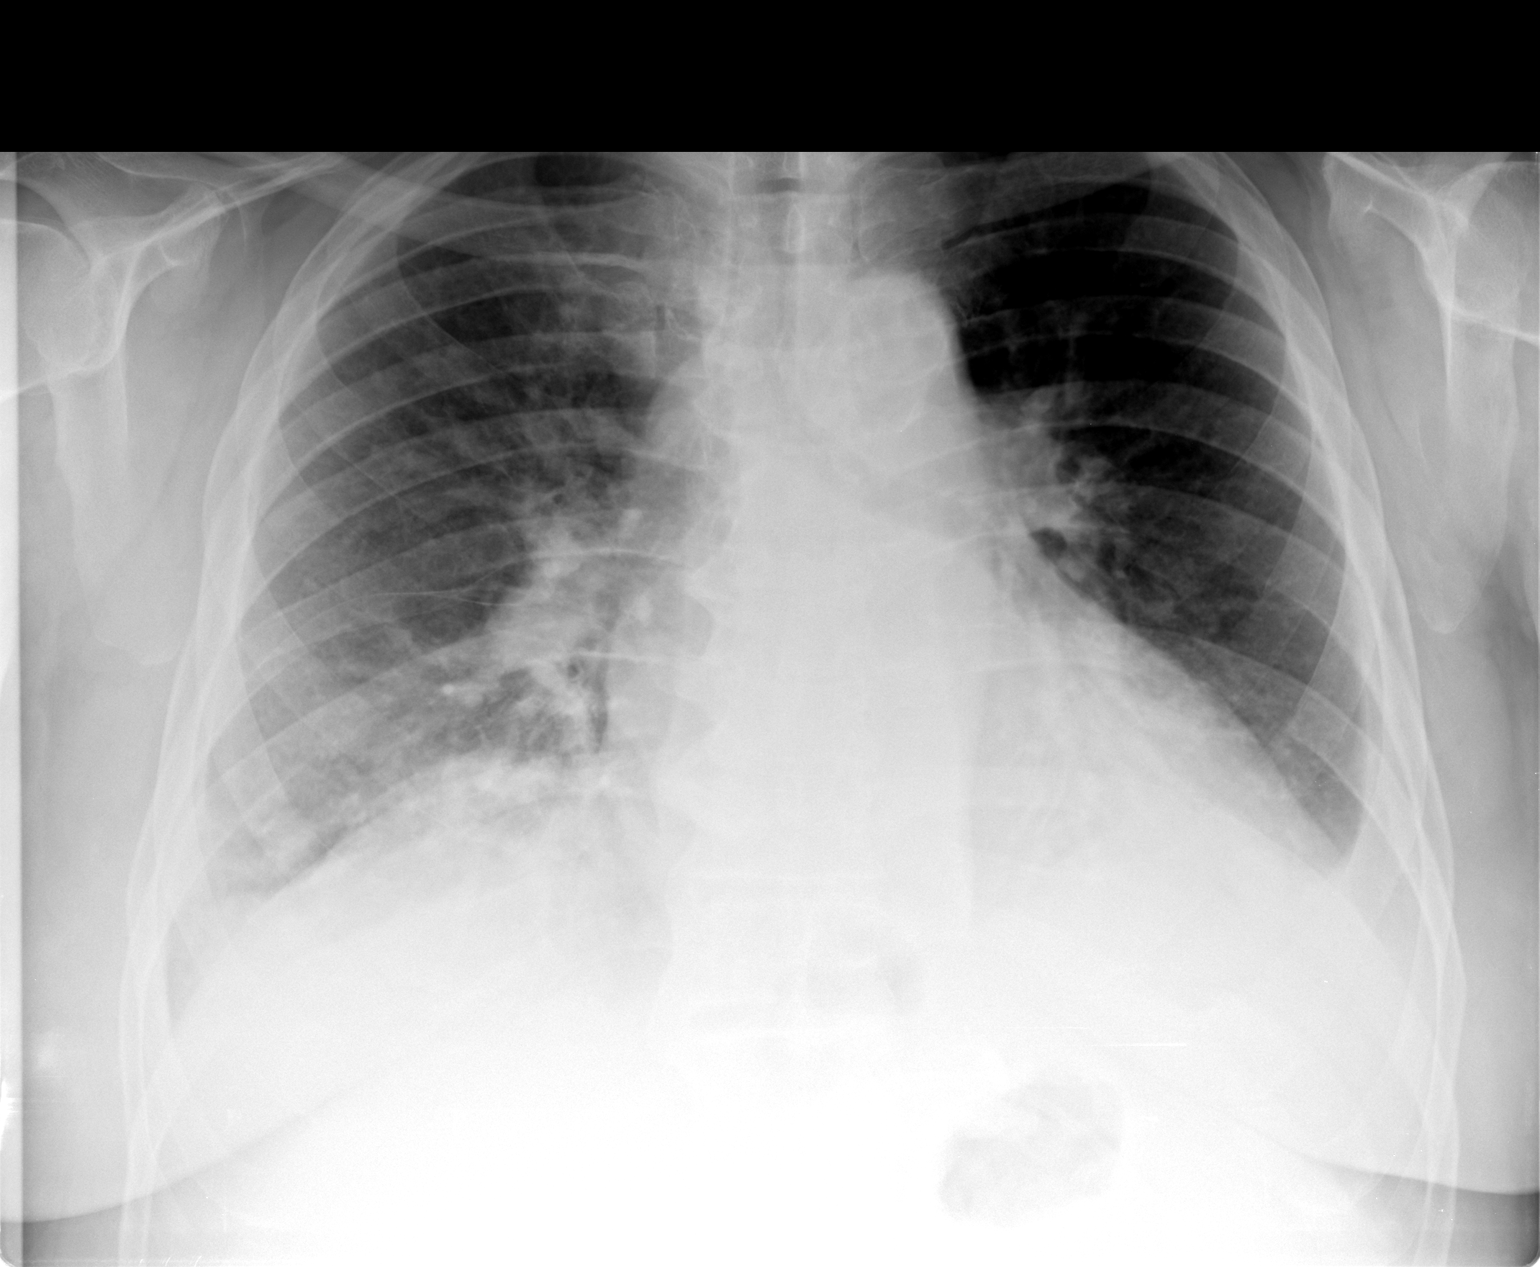

[view not recorded (2 of 2)]
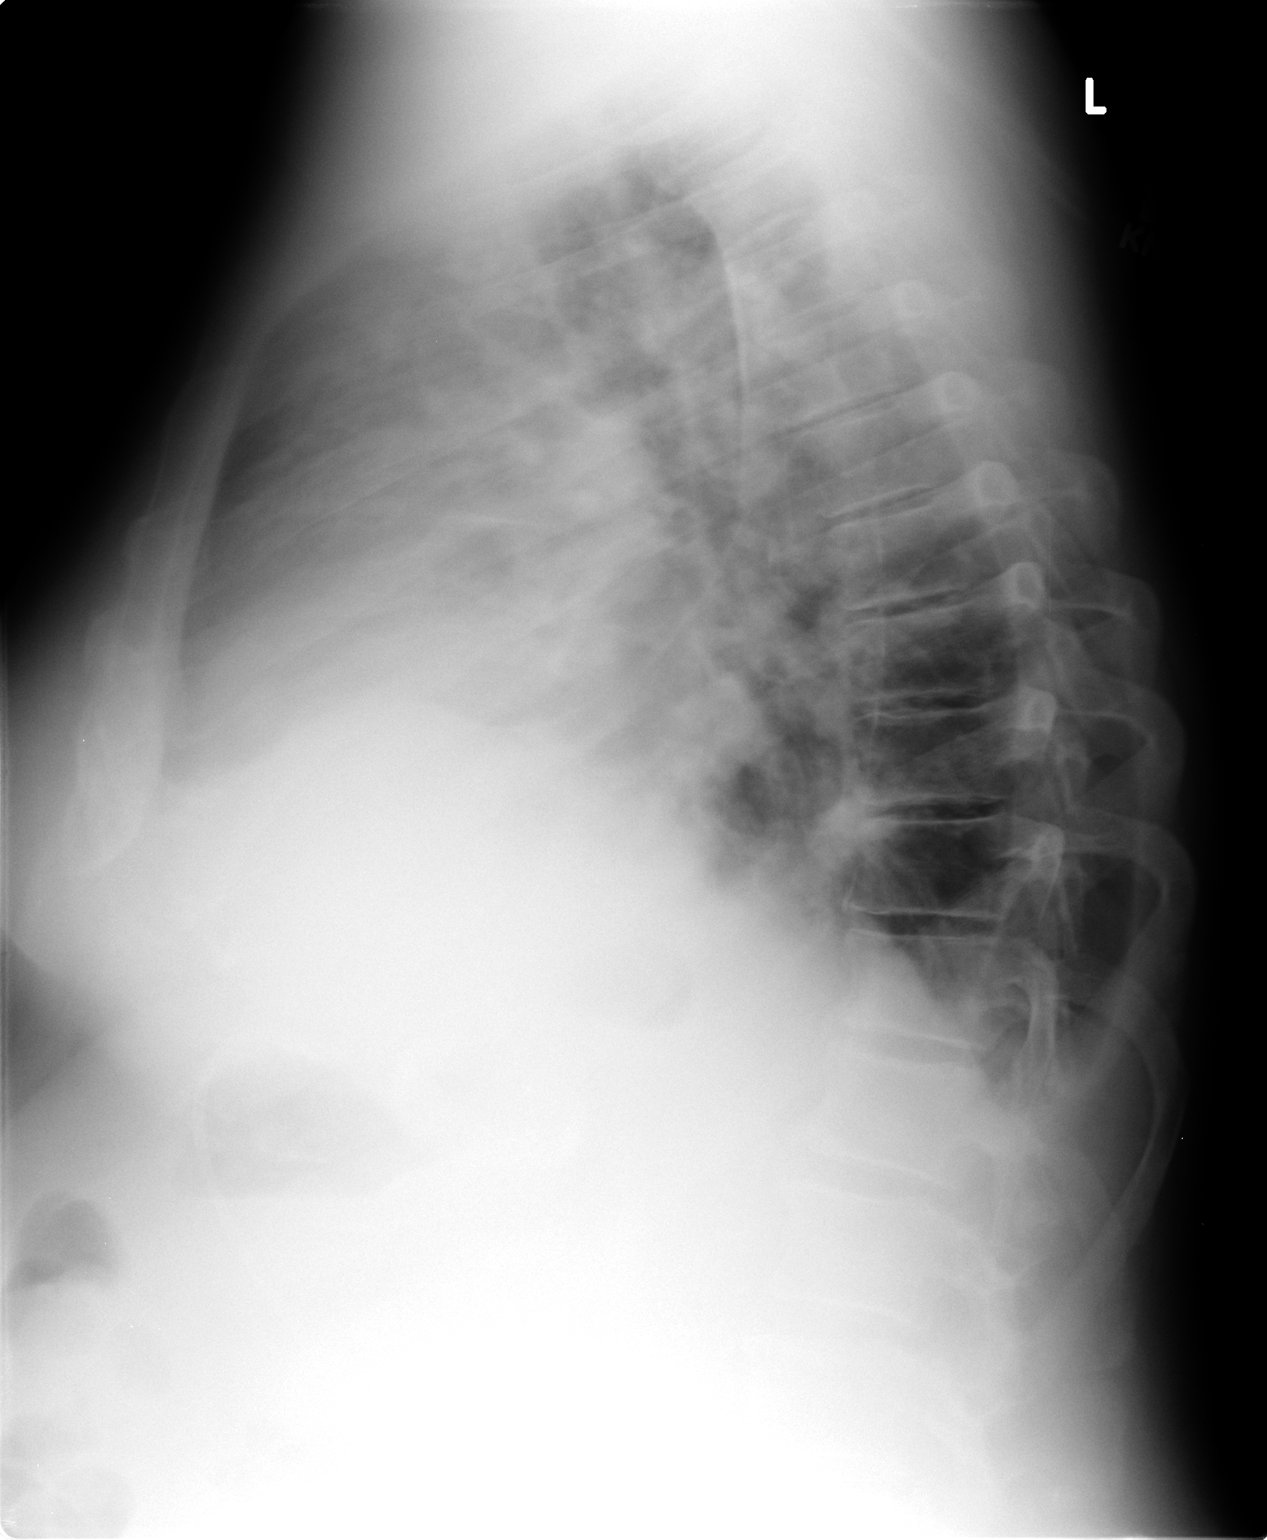

[2 of 2 positions shown; findings below may reference images not displayed]

FINDINGS: Interval increase in central pulmonary vascular
congestion and patchy interstitial and airspace opacities in both
lower lobes.  Stable mild cardiomegaly.  Small left pleural
effusion as before.  Atheromatous aortic arch.
IMPRESSION: 1.  Worsening vascular congestion and bibasilar infiltrates/edema.
2.  Stable cardiomegaly and left effusion.

## 2013-09-12 IMAGING — US US RENAL
1 series · 14 of 25 positions shown · non-contrast
Comparison: Ultrasound 12/26/2007.

CLINICAL DATA: Stage III kidney disease.

RENAL/URINARY TRACT ULTRASOUND COMPLETE

[Series 1: us renal · 0.26mm/px · 14 of 29 slices shown]
[im 1/29]
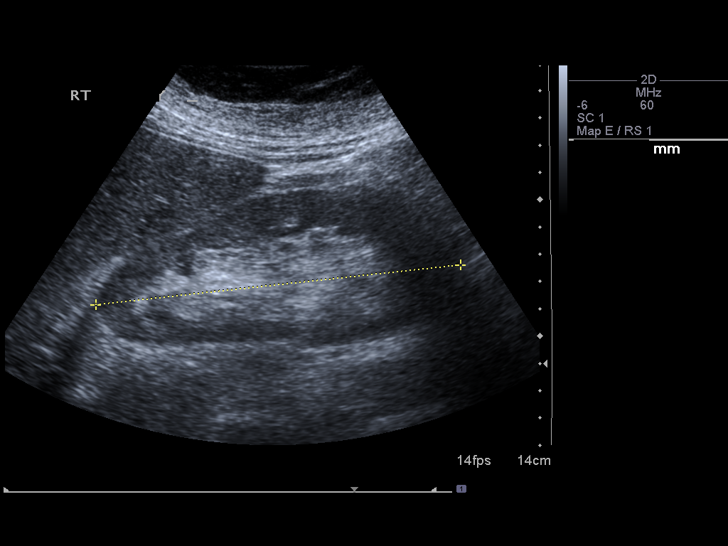
[im 3/29]
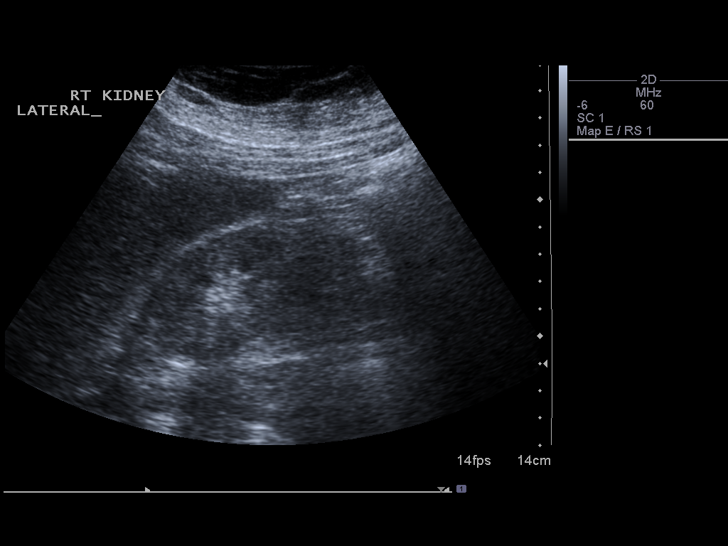
[im 5/29]
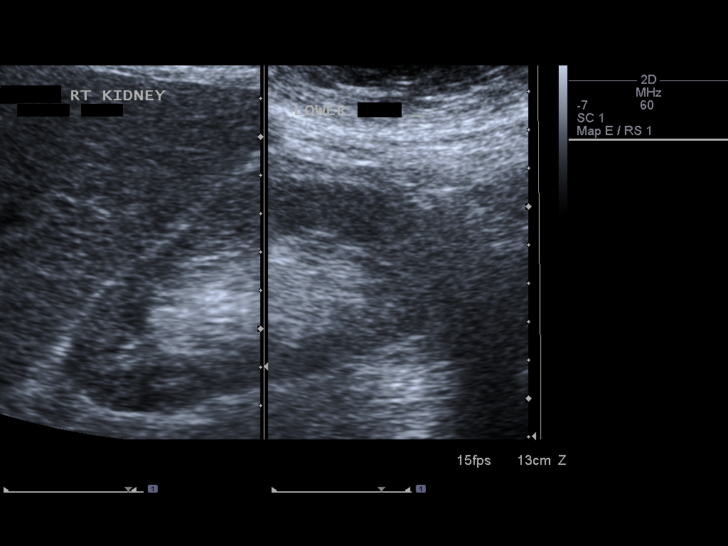
[im 8/29]
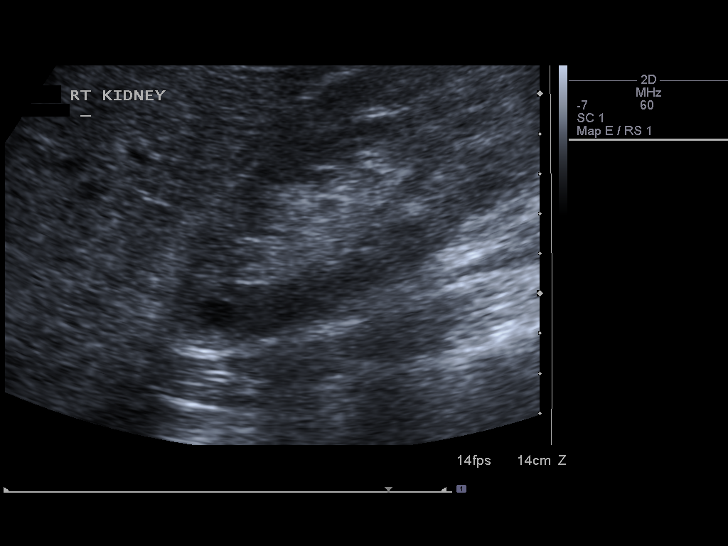
[im 10/29]
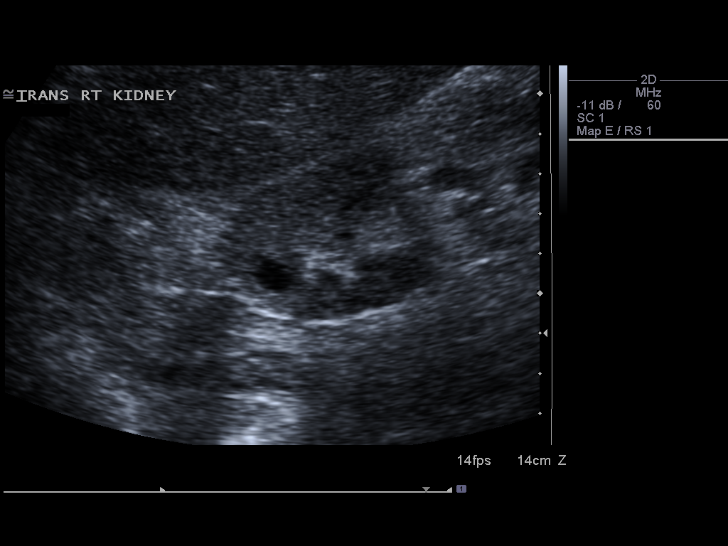
[im 11/29]
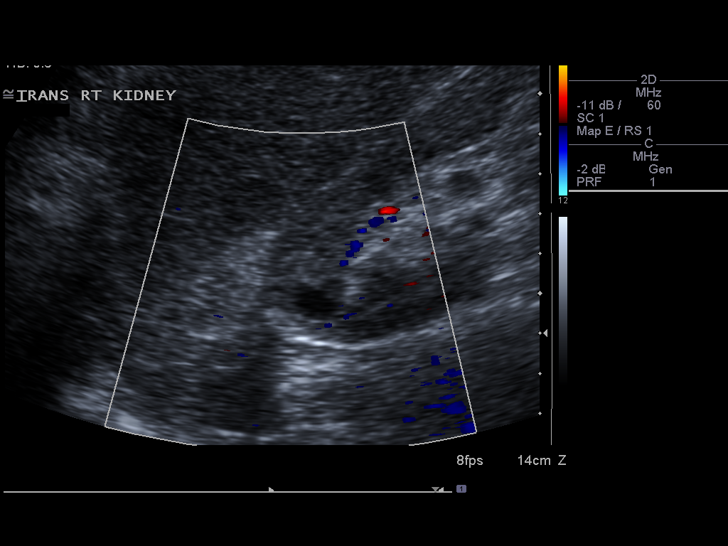
[im 13/29]
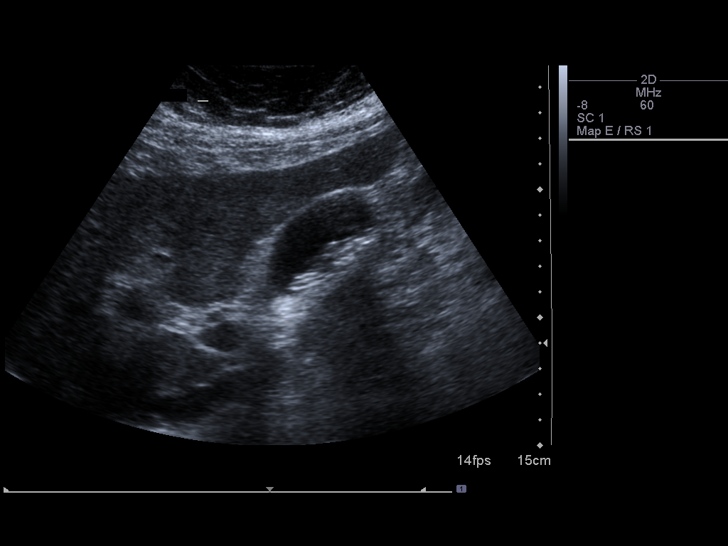
[im 16/29]
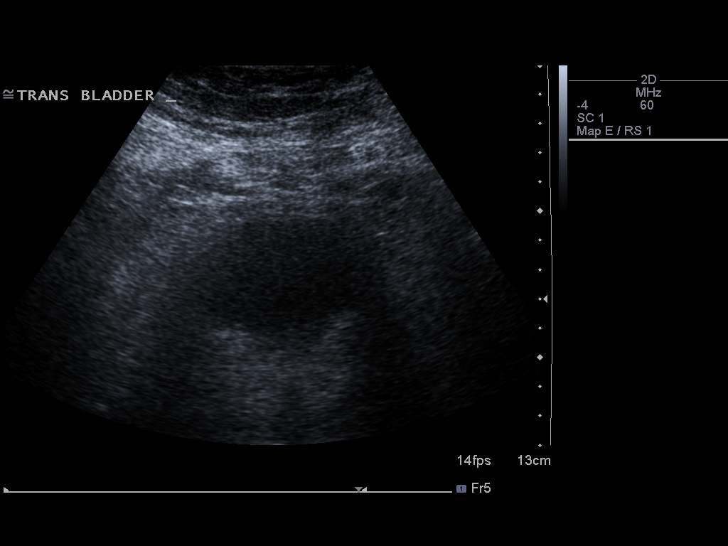
[im 18/29]
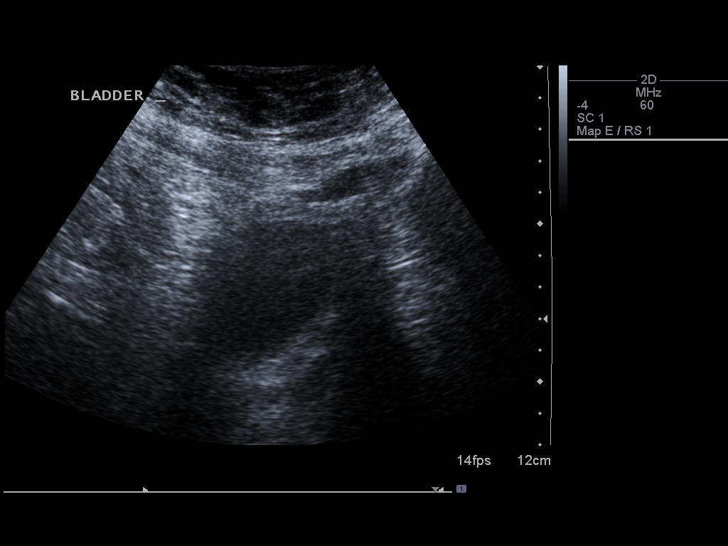
[im 19/29]
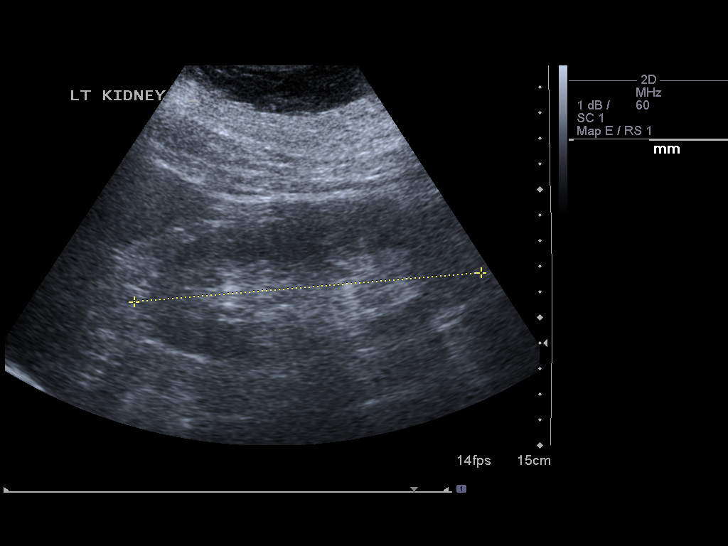
[im 22/29]
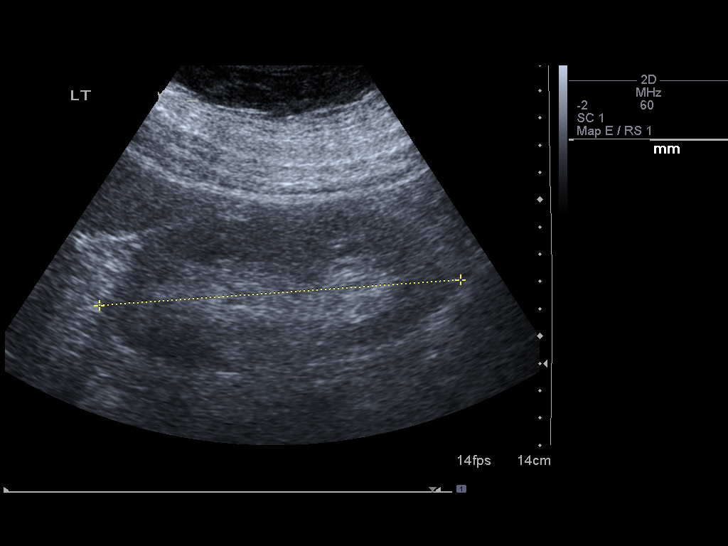
[im 24/29]
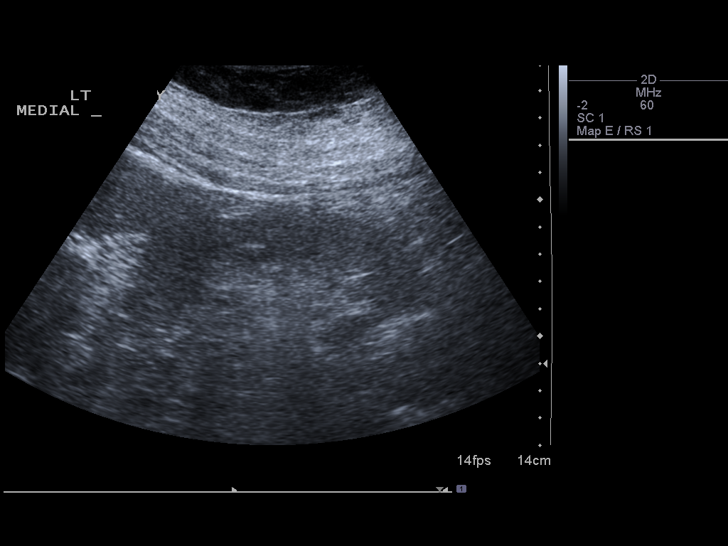
[im 26/29]
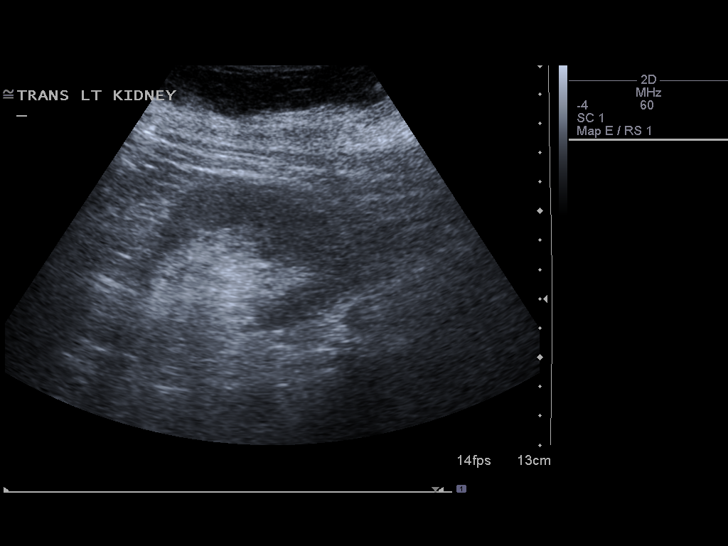
[im 29/29]
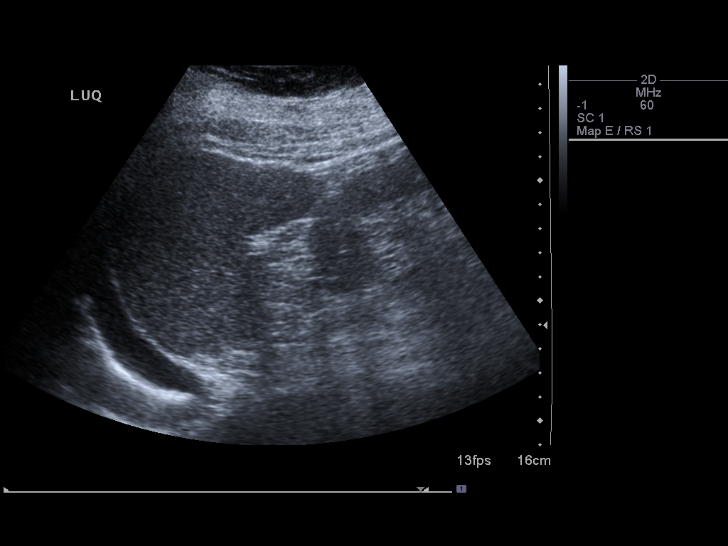

[14 of 25 positions shown; findings below may reference images not displayed]

FINDINGS: Right Kidney:  13.5 cm in length.  Normal renal cortical thickness
and slight increased echogenicity without hydronephrosis.  A 9 mm
cyst is noted in the upper pole region.

Left Kidney:  13.6 cm in length.  Normal renal cortical thickness
and slight increased echogenicity without focal lesion or
hydronephrosis.

Bladder:  Not well distended but no gross abnormality.

Additional findings:  Cholelithiasis and left pleural effusion.
IMPRESSION: 1. Slight increased echogenicity of both kidneys but no atrophy or
hydronephrosis.
2.  Cholelithiasis and left pleural effusion.
